# Patient Record
Sex: Male | Born: 1965
Health system: Southern US, Community
[De-identification: ages and names within clinical notes are randomized; demographics above are authoritative.]

## PROBLEM LIST (undated history)

## (undated) DIAGNOSIS — E1110 Type 2 diabetes mellitus with ketoacidosis without coma: Secondary | ICD-10-CM

## (undated) DIAGNOSIS — W3400XA Accidental discharge from unspecified firearms or gun, initial encounter: Secondary | ICD-10-CM

## (undated) DIAGNOSIS — G709 Myoneural disorder, unspecified: Secondary | ICD-10-CM

## (undated) DIAGNOSIS — R945 Abnormal results of liver function studies: Secondary | ICD-10-CM

## (undated) DIAGNOSIS — N179 Acute kidney failure, unspecified: Secondary | ICD-10-CM

## (undated) DIAGNOSIS — I480 Paroxysmal atrial fibrillation: Secondary | ICD-10-CM

## (undated) DIAGNOSIS — I499 Cardiac arrhythmia, unspecified: Secondary | ICD-10-CM

## (undated) DIAGNOSIS — M25561 Pain in right knee: Secondary | ICD-10-CM

## (undated) DIAGNOSIS — M25562 Pain in left knee: Secondary | ICD-10-CM

## (undated) DIAGNOSIS — Z87442 Personal history of urinary calculi: Secondary | ICD-10-CM

## (undated) DIAGNOSIS — I4892 Unspecified atrial flutter: Secondary | ICD-10-CM

## (undated) DIAGNOSIS — F32A Depression, unspecified: Secondary | ICD-10-CM

## (undated) DIAGNOSIS — R011 Cardiac murmur, unspecified: Secondary | ICD-10-CM

## (undated) DIAGNOSIS — E119 Type 2 diabetes mellitus without complications: Secondary | ICD-10-CM

## (undated) DIAGNOSIS — E785 Hyperlipidemia, unspecified: Secondary | ICD-10-CM

## (undated) DIAGNOSIS — K219 Gastro-esophageal reflux disease without esophagitis: Secondary | ICD-10-CM

## (undated) DIAGNOSIS — F329 Major depressive disorder, single episode, unspecified: Secondary | ICD-10-CM

## (undated) DIAGNOSIS — I10 Essential (primary) hypertension: Secondary | ICD-10-CM

## (undated) DIAGNOSIS — M109 Gout, unspecified: Secondary | ICD-10-CM

## (undated) HISTORY — DX: Unspecified atrial flutter: I48.92

## (undated) HISTORY — PX: SPLENECTOMY, TOTAL: SHX788

## (undated) HISTORY — DX: Type 2 diabetes mellitus with ketoacidosis without coma: E11.10

## (undated) HISTORY — PX: FRACTURE SURGERY: SHX138

## (undated) HISTORY — DX: Hyperlipidemia, unspecified: E78.5

## (undated) HISTORY — PX: ABDOMINAL SURGERY: SHX537

## (undated) HISTORY — DX: Paroxysmal atrial fibrillation: I48.0

## (undated) HISTORY — PX: OTHER SURGICAL HISTORY: SHX169

## (undated) HISTORY — DX: Myoneural disorder, unspecified: G70.9

---
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
## 2007-06-02 ENCOUNTER — Emergency Department (HOSPITAL_COMMUNITY): Admission: EM | Admit: 2007-06-02 | Discharge: 2007-06-02 | Payer: Self-pay | Admitting: Emergency Medicine

## 2007-06-10 ENCOUNTER — Emergency Department (HOSPITAL_COMMUNITY): Admission: EM | Admit: 2007-06-10 | Discharge: 2007-06-10 | Payer: Self-pay | Admitting: Emergency Medicine

## 2008-05-17 ENCOUNTER — Emergency Department (HOSPITAL_COMMUNITY): Admission: EM | Admit: 2008-05-17 | Discharge: 2008-05-17 | Payer: Self-pay | Admitting: Emergency Medicine

## 2010-07-05 LAB — URINE MICROSCOPIC-ADD ON

## 2010-07-05 LAB — COMPREHENSIVE METABOLIC PANEL
AST: 33 U/L (ref 0–37)
Albumin: 4.2 g/dL (ref 3.5–5.2)
Chloride: 102 mEq/L (ref 96–112)
Creatinine, Ser: 1.07 mg/dL (ref 0.4–1.5)
GFR calc Af Amer: >60 mL/min (ref >60)
Potassium: 3.9 mEq/L (ref 3.5–5.1)
Total Bilirubin: 1 mg/dL (ref 0.3–1.2)
Total Protein: 7.4 g/dL (ref 6.0–8.3)

## 2010-07-05 LAB — CBC
MCV: 85.7 fL (ref 78.0–100.0)
Platelets: 251 10*3/uL (ref 150–400)
RDW: 14.7 % (ref 11.5–15.5)
WBC: 10.8 10*3/uL — ABNORMAL HIGH (ref 4.0–10.5)

## 2010-07-05 LAB — DIFFERENTIAL
Basophils Absolute: 0 10*3/uL (ref 0.0–0.1)
Eosinophils Relative: 1 % (ref 0–5)
Lymphocytes Relative: 20 % (ref 12–46)
Monocytes Absolute: 0.6 10*3/uL (ref 0.1–1.0)
Monocytes Relative: 6 % (ref 3–12)

## 2010-07-05 LAB — URINALYSIS, ROUTINE W REFLEX MICROSCOPIC
Glucose, UA: NEGATIVE mg/dL
Leukocytes, UA: NEGATIVE
pH: 6 (ref 5.0–8.0)

## 2010-09-02 ENCOUNTER — Inpatient Hospital Stay (INDEPENDENT_AMBULATORY_CARE_PROVIDER_SITE_OTHER)
Admission: RE | Admit: 2010-09-02 | Discharge: 2010-09-02 | Disposition: A | Discharge status: Home / Self Care | Payer: Self-pay | Source: Ambulatory Visit | Attending: Emergency Medicine | Admitting: Emergency Medicine

## 2010-09-02 DIAGNOSIS — L259 Unspecified contact dermatitis, unspecified cause: Secondary | ICD-10-CM

## 2010-12-12 LAB — I-STAT 8, (EC8 V) (CONVERTED LAB)
BUN: 10
Bicarbonate: 20.1
Glucose, Bld: 139 — ABNORMAL HIGH
Sodium: 138
TCO2: 21
pCO2, Ven: 36 — ABNORMAL LOW
pH, Ven: 7.355 — ABNORMAL HIGH

## 2010-12-12 LAB — CBC
HCT: 41.1
Hemoglobin: 13.8
MCHC: 33.5
MCV: 83.8
RBC: 4.91
RDW: 14.4

## 2010-12-12 LAB — POCT I-STAT CREATININE: Creatinine, Ser: 1.2

## 2011-03-03 ENCOUNTER — Encounter: Payer: Self-pay | Admitting: Emergency Medicine

## 2011-03-03 ENCOUNTER — Emergency Department (INDEPENDENT_AMBULATORY_CARE_PROVIDER_SITE_OTHER)
Admission: EM | Admit: 2011-03-03 | Discharge: 2011-03-03 | Disposition: A | Discharge status: Home / Self Care | Payer: Self-pay | Source: Home / Self Care | Attending: Family Medicine | Admitting: Family Medicine

## 2011-03-03 DIAGNOSIS — M109 Gout, unspecified: Secondary | ICD-10-CM

## 2011-03-03 LAB — URIC ACID: Uric Acid, Serum: 7.5 mg/dL (ref 4.0–7.8)

## 2011-03-03 MED ORDER — INDOMETHACIN 50 MG PO CAPS: 50.0000 mg | ORAL_CAPSULE | Freq: Three times a day (TID) | ORAL | Status: AC

## 2011-03-03 NOTE — ED Provider Notes (Signed)
History     CSN: [id number] Arrival date & time: 03/03/2011 12:40 PM   First MD Initiated Contact with Patient 03/03/11 1328      Chief Complaint  Patient presents with  . Toe Pain    (Consider location/radiation/quality/duration/timing/severity/associated sxs/prior treatment) HPI Comments: Earnest Rosier Tawni Carnes) presents for evaluation of pain, warmth in the MP joint of his RIGHT great toe. He reports a similar hx last year around Thanksgiving, that resolved after he drank cherry juice and took Aleve. He does not carry a formal diagnosis.   Patient is a 45 y.o. male presenting with toe pain. The history is provided by the patient.  Toe Pain This is a recurrent problem. The current episode started more than 2 days ago. The problem occurs constantly. The problem has not changed since onset.The symptoms are aggravated by bending. The symptoms are relieved by nothing.    History reviewed. No pertinent past medical history.  History reviewed. No pertinent past surgical history.  History reviewed. No pertinent family history.  History  Substance Use Topics  . Smoking status: Never Smoker   . Smokeless tobacco: Current User  . Alcohol Use: Yes     Occasional      Review of Systems  Constitutional: Negative.   HENT: Negative.   Eyes: Negative.   Respiratory: Negative.   Cardiovascular: Negative.   Gastrointestinal: Negative.   Genitourinary: Negative.   Musculoskeletal: Positive for arthralgias. Negative for joint swelling.  Skin: Negative.   Neurological: Negative.     Allergies  Review of patient's allergies indicates no known allergies.  Home Medications   Current Outpatient Rx  Name Route Sig Dispense Refill  . INDOMETHACIN 50 MG PO CAPS Oral Take 1 capsule (50 mg total) by mouth 3 (three) times daily with meals. 30 capsule 0    BP 149/100  Pulse 78  Temp(Src) 98 F (36.7 C) (Oral)  Resp 20  SpO2 97%  Physical Exam  Nursing note and vitals  reviewed. Constitutional: He is oriented to person, place, and time. He appears well-developed and well-nourished.  HENT:  Head: Normocephalic and atraumatic.  Eyes: EOM are normal.  Neck: Normal range of motion.  Pulmonary/Chest: Effort normal.  Musculoskeletal:       Right foot: He exhibits decreased range of motion and tenderness.       Feet:  Neurological: He is alert and oriented to person, place, and time.  Skin: Skin is warm and dry.  Psychiatric: His behavior is normal.    ED Course  Procedures (including critical care time)   Labs Reviewed  URIC ACID   No results found.   1. Gout       MDM  Uric acid level drawn, pending; will treat empirically and follow result.        Richardo Priest, MD 03/03/11 [phone]

## 2011-03-03 NOTE — ED Notes (Addendum)
Monday toe great toe started hurting, no injury that he is aware of. Pt worries he may have over done it when working out over the weekend. Pain started in knee, then moved to toe. Knee does not hurt. Pt has had Gout in the past.

## 2011-11-02 ENCOUNTER — Encounter (HOSPITAL_COMMUNITY): Payer: Self-pay | Admitting: Emergency Medicine

## 2011-11-02 ENCOUNTER — Emergency Department (HOSPITAL_COMMUNITY)
Admission: EM | Admit: 2011-11-02 | Discharge: 2011-11-02 | Disposition: A | Discharge status: Home / Self Care | Payer: Self-pay | Source: Home / Self Care | Attending: Emergency Medicine | Admitting: Emergency Medicine

## 2011-11-02 DIAGNOSIS — M7040 Prepatellar bursitis, unspecified knee: Secondary | ICD-10-CM

## 2011-11-02 DIAGNOSIS — M7042 Prepatellar bursitis, left knee: Secondary | ICD-10-CM

## 2011-11-02 HISTORY — DX: Pain in right knee: M25.562

## 2011-11-02 HISTORY — DX: Accidental discharge from unspecified firearms or gun, initial encounter: W34.00XA

## 2011-11-02 HISTORY — DX: Pain in right knee: M25.561

## 2011-11-02 MED ORDER — MELOXICAM 15 MG PO TABS: 15.0000 mg | ORAL_TABLET | Freq: Every day | ORAL | Status: AC

## 2011-11-02 MED ORDER — HYDROCODONE-ACETAMINOPHEN 5-325 MG PO TABS: 2.0000 | ORAL_TABLET | ORAL | Status: AC | PRN
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
## 2011-11-02 NOTE — ED Notes (Signed)
Instructed to provide work note

## 2011-11-02 NOTE — ED Notes (Signed)
Reports left knee pain onset one week ago.  No particular injury.  Reports pain getting worse, history of the same.  Reports pain above left knee the "tendon"

## 2016-01-30 ENCOUNTER — Emergency Department (HOSPITAL_COMMUNITY): Payer: BLUE CROSS/BLUE SHIELD

## 2016-01-30 ENCOUNTER — Emergency Department (HOSPITAL_COMMUNITY)
Admission: EM | Admit: 2016-01-30 | Discharge: 2016-01-30 | Disposition: A | Discharge status: Home / Self Care | Payer: BLUE CROSS/BLUE SHIELD | Attending: Emergency Medicine | Admitting: Emergency Medicine

## 2016-01-30 ENCOUNTER — Encounter (HOSPITAL_COMMUNITY): Payer: Self-pay | Admitting: Emergency Medicine

## 2016-01-30 DIAGNOSIS — R55 Syncope and collapse: Secondary | ICD-10-CM | POA: Diagnosis not present

## 2016-01-30 DIAGNOSIS — Z79899 Other long term (current) drug therapy: Secondary | ICD-10-CM | POA: Insufficient documentation

## 2016-01-30 DIAGNOSIS — N200 Calculus of kidney: Secondary | ICD-10-CM

## 2016-01-30 DIAGNOSIS — N132 Hydronephrosis with renal and ureteral calculous obstruction: Secondary | ICD-10-CM | POA: Diagnosis not present

## 2016-01-30 DIAGNOSIS — R1031 Right lower quadrant pain: Secondary | ICD-10-CM | POA: Diagnosis present

## 2016-01-30 LAB — COMPREHENSIVE METABOLIC PANEL
ALT: 30 U/L (ref 17–63)
ANION GAP: 12 (ref 5–15)
AST: 28 U/L (ref 15–41)
Albumin: 4.9 g/dL (ref 3.5–5.0)
Alkaline Phosphatase: 99 U/L (ref 38–126)
BUN: 10 mg/dL (ref 6–20)
CALCIUM: 9.3 mg/dL (ref 8.9–10.3)
CHLORIDE: 100 mmol/L — AB (ref 101–111)
CO2: 27 mmol/L (ref 22–32)
Creatinine, Ser: 1.2 mg/dL (ref 0.61–1.24)
GFR calc non Af Amer: >60 mL/min (ref >60)
Glucose, Bld: 97 mg/dL (ref 65–99)
POTASSIUM: 4.3 mmol/L (ref 3.5–5.1)
SODIUM: 139 mmol/L (ref 135–145)
Total Bilirubin: 0.4 mg/dL (ref 0.3–1.2)
Total Protein: 8.7 g/dL — ABNORMAL HIGH (ref 6.5–8.1)

## 2016-01-30 LAB — CBC WITH DIFFERENTIAL/PLATELET
BASOS PCT: 0 %
Basophils Absolute: 0 10*3/uL (ref 0.0–0.1)
EOS ABS: 0.1 10*3/uL (ref 0.0–0.7)
EOS PCT: 1 %
HCT: 45.7 % (ref 39.0–52.0)
Hemoglobin: 15 g/dL (ref 13.0–17.0)
LYMPHS ABS: 1.7 10*3/uL (ref 0.7–4.0)
Lymphocytes Relative: 26 %
MCH: 27.7 pg (ref 26.0–34.0)
MCHC: 32.8 g/dL (ref 30.0–36.0)
MCV: 84.3 fL (ref 78.0–100.0)
MONOS PCT: 7 %
Monocytes Absolute: 0.5 10*3/uL (ref 0.1–1.0)
Neutro Abs: 4.4 10*3/uL (ref 1.7–7.7)
Neutrophils Relative %: 66 %
PLATELETS: 278 10*3/uL (ref 150–400)
RBC: 5.42 MIL/uL (ref 4.22–5.81)
RDW: 14.3 % (ref 11.5–15.5)
WBC: 6.7 10*3/uL (ref 4.0–10.5)

## 2016-01-30 LAB — URINALYSIS, ROUTINE W REFLEX MICROSCOPIC
Bilirubin Urine: NEGATIVE
Glucose, UA: NEGATIVE mg/dL
KETONES UR: NEGATIVE mg/dL
LEUKOCYTES UA: NEGATIVE
NITRITE: NEGATIVE
PH: 6 (ref 5.0–8.0)
Protein, ur: 100 mg/dL — AB
SPECIFIC GRAVITY, URINE: 1.021 (ref 1.005–1.030)

## 2016-01-30 LAB — RAPID URINE DRUG SCREEN, HOSP PERFORMED
Amphetamines: NOT DETECTED
BARBITURATES: NOT DETECTED
BENZODIAZEPINES: NOT DETECTED
COCAINE: NOT DETECTED
Opiates: NOT DETECTED
Tetrahydrocannabinol: NOT DETECTED

## 2016-01-30 LAB — I-STAT TROPONIN, ED: Troponin i, poc: 0.01 ng/mL (ref 0.00–0.08)

## 2016-01-30 LAB — URINE MICROSCOPIC-ADD ON
BACTERIA UA: NONE SEEN
Squamous Epithelial / LPF: NONE SEEN

## 2016-01-30 MED ORDER — SODIUM CHLORIDE 0.9 % IV BOLUS (SEPSIS)
1000.0000 mL | Freq: Once | INTRAVENOUS | Status: AC
  Administered 2016-01-30: 1000 mL via INTRAVENOUS

## 2016-01-30 MED ORDER — NAPROXEN 500 MG PO TABS: 500.0000 mg | ORAL_TABLET | Freq: Two times a day (BID) | ORAL | 0 refills | Status: DC

## 2016-01-30 MED ORDER — ONDANSETRON 4 MG PO TBDP: 4.0000 mg | ORAL_TABLET | Freq: Three times a day (TID) | ORAL | 0 refills | Status: DC | PRN

## 2016-01-30 MED ORDER — OXYCODONE-ACETAMINOPHEN 5-325 MG PO TABS: 1.0000 | ORAL_TABLET | Freq: Four times a day (QID) | ORAL | 0 refills | Status: DC | PRN

## 2016-01-30 MED ORDER — KETOROLAC TROMETHAMINE 30 MG/ML IJ SOLN
30.0000 mg | Freq: Once | INTRAMUSCULAR | Status: AC
  Administered 2016-01-30: 30 mg via INTRAVENOUS
  Filled 2016-01-30: qty 1

## 2016-01-30 MED ORDER — ONDANSETRON HCL 4 MG/2ML IJ SOLN
4.0000 mg | Freq: Once | INTRAMUSCULAR | Status: AC
  Administered 2016-01-30: 4 mg via INTRAVENOUS
  Filled 2016-01-30: qty 2

## 2016-01-30 NOTE — ED Notes (Signed)
PA at bedside.
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
## 2016-01-30 NOTE — ED Notes (Signed)
Pt ambulated to restroom earlier in shift

## 2016-01-30 NOTE — ED Notes (Signed)
MD at bedside. 

## 2016-01-30 NOTE — ED Notes (Signed)
Pt.'s wife at bedside and told this Nurse that pt. did passed out in the bathroom but not sure how long.

## 2016-01-30 NOTE — ED Notes (Signed)
Per MD order  U/A. Pt asleep w/family at bedside. Urinal at bedside for pt to use once awake to provide sample. Family member states will inform pt to provide sample once awake. [employer]

## 2016-01-30 NOTE — ED Triage Notes (Signed)
Per EMS , pt. From home with complaint of  right flank pain at 10/10 upon waking up this morning at 5am. Pt. Also reported of near syncope . Pt. Was diagnosed of kidney stone on Thursday, on pain med. But this morning pain was getting worse. initail BP taken by EMS was 88/5941mmhg, pt. On BP meds daily. Received about 100ml of NS via EMS, latest BP 135/7888mmhg.

## 2016-01-30 NOTE — ED Notes (Signed)
Bed: WA23 Expected date:  Expected time:  Means of arrival:  Comments: EMS 

## 2016-01-30 NOTE — ED Notes (Signed)
F/U re: U/A sample. Pt out of room completing diagnostic exam. Will f/u upon return. [employer]

## 2016-01-30 NOTE — Discharge Instructions (Signed)
You were noted to have a 7mm kidney stone on the right side. This stone is almost to the bladder and may or may not pass on its own. The sensation you experienced this morning is suspected to be what is called a vasovagal reaction to pain. It is recommended that you follow-up with urology as soon as possible, but should be seen sometime in the coming week.  Continue using naproxen or ibuprofen for pain. Percocet for severe pain. Do not take medications like tramadol or Percocet while driving or performing other dangerous activities. Zofran for nausea or vomiting.

## 2016-02-03 ENCOUNTER — Other Ambulatory Visit: Payer: Self-pay | Admitting: Urology

## 2016-02-03 ENCOUNTER — Encounter (HOSPITAL_COMMUNITY): Payer: Self-pay | Admitting: *Deleted

## 2016-02-07 ENCOUNTER — Other Ambulatory Visit: Payer: Self-pay | Admitting: Urology

## 2016-02-07 ENCOUNTER — Ambulatory Visit (HOSPITAL_COMMUNITY): Admission: RE | Admit: 2016-02-07 | Payer: BLUE CROSS/BLUE SHIELD | Source: Ambulatory Visit | Admitting: Urology

## 2016-02-07 HISTORY — DX: Essential (primary) hypertension: I10

## 2016-02-07 HISTORY — DX: Personal history of urinary calculi: Z87.442

## 2016-02-07 SURGERY — LITHOTRIPSY, ESWL
Anesthesia: LOCAL | Laterality: Right

## 2016-02-08 ENCOUNTER — Encounter (HOSPITAL_COMMUNITY): Payer: Self-pay | Admitting: *Deleted

## 2016-02-14 ENCOUNTER — Ambulatory Visit (HOSPITAL_COMMUNITY)
Admission: RE | Admit: 2016-02-14 | Discharge: 2016-02-14 | Disposition: A | Discharge status: Home / Self Care | Payer: BLUE CROSS/BLUE SHIELD | Source: Ambulatory Visit | Attending: Urology | Admitting: Urology

## 2016-02-14 ENCOUNTER — Encounter (HOSPITAL_COMMUNITY): Payer: Self-pay | Admitting: General Practice

## 2016-02-14 ENCOUNTER — Encounter (HOSPITAL_COMMUNITY)
Admission: RE | Disposition: A | Discharge status: Home / Self Care | Payer: Self-pay | Source: Ambulatory Visit | Attending: Urology

## 2016-02-14 ENCOUNTER — Ambulatory Visit (HOSPITAL_COMMUNITY): Payer: BLUE CROSS/BLUE SHIELD

## 2016-02-14 DIAGNOSIS — I10 Essential (primary) hypertension: Secondary | ICD-10-CM | POA: Diagnosis not present

## 2016-02-14 DIAGNOSIS — F17200 Nicotine dependence, unspecified, uncomplicated: Secondary | ICD-10-CM | POA: Diagnosis not present

## 2016-02-14 DIAGNOSIS — N201 Calculus of ureter: Secondary | ICD-10-CM | POA: Insufficient documentation

## 2016-02-14 SURGERY — LITHOTRIPSY, ESWL
Anesthesia: LOCAL | Laterality: Right

## 2016-02-14 MED ORDER — CIPROFLOXACIN HCL 500 MG PO TABS
500.0000 mg | ORAL_TABLET | ORAL | Status: AC
  Administered 2016-02-14: 500 mg via ORAL
  Filled 2016-02-14: qty 1

## 2016-02-14 MED ORDER — SODIUM CHLORIDE 0.9 % IV SOLN
INTRAVENOUS | Status: DC
  Administered 2016-02-14: 07:00:00 via INTRAVENOUS

## 2016-02-14 MED ORDER — DIPHENHYDRAMINE HCL 25 MG PO CAPS
25.0000 mg | ORAL_CAPSULE | ORAL | Status: AC
  Administered 2016-02-14: 25 mg via ORAL
  Filled 2016-02-14: qty 1

## 2016-02-14 MED ORDER — OXYCODONE HCL 10 MG PO TABS: 10.0000 mg | ORAL_TABLET | ORAL | 0 refills | Status: DC | PRN

## 2016-02-14 MED ORDER — TAMSULOSIN HCL 0.4 MG PO CAPS: 0.4000 mg | ORAL_CAPSULE | ORAL | 0 refills | Status: DC

## 2016-02-14 MED ORDER — DIAZEPAM 5 MG PO TABS
10.0000 mg | ORAL_TABLET | ORAL | Status: AC
  Administered 2016-02-14: 10 mg via ORAL
  Filled 2016-02-14: qty 2

## 2016-02-14 NOTE — Discharge Instructions (Signed)
Lithotripsy, Care After °Refer to this sheet in the next few weeks. These instructions provide you with information on caring for yourself after your procedure. Your health care provider may also give you more specific instructions. Your treatment has been planned according to current medical practices, but problems sometimes occur. Call your health care provider if you have any problems or questions after your procedure. °WHAT TO EXPECT AFTER THE PROCEDURE  °· Your urine may have a red tinge for a few days after treatment. Blood loss is usually minimal. °· You may have soreness in the back or flank area. This usually goes away after a few days. The procedure can cause blotches or bruises on the back where the pressure wave enters the skin. These marks usually cause only minimal discomfort and should disappear in a short time. °· Stone fragments should begin to pass within 24 hours of treatment. However, a delayed passage is not unusual. °· You may have pain, discomfort, and feel sick to your stomach (nauseated) when the crushed fragments of stone are passed down the tube from the kidney to the bladder. Stone fragments can pass soon after the procedure and may last for up to 4-8 weeks. °· A small number of patients may have severe pain when stone fragments are not able to pass, which leads to an obstruction. °· If your stone is greater than 1 inch (2.5 cm) in diameter or if you have multiple stones that have a combined diameter greater than 1 inch (2.5 cm), you may require more than one treatment. °· If you had a stent placed prior to your procedure, you may experience some discomfort, especially during urination. You may experience the pain or discomfort in your flank or back, or you may experience a sharp pain or discomfort at the base of your penis or in your lower abdomen. The discomfort usually lasts only a few minutes after urinating. °HOME CARE INSTRUCTIONS  °· Rest at home until you feel your energy  improving. °· Only take over-the-counter or prescription medicines for pain, discomfort, or fever as directed by your health care provider. Depending on the type of lithotripsy, you may need to take antibiotics and anti-inflammatory medicines for a few days. °· Drink enough water and fluids to keep your urine clear or pale yellow. This helps "flush" your kidneys. It helps pass any remaining pieces of stone and prevents stones from coming back. °· Most people can resume daily activities within 1-2 days after standard lithotripsy. It can take longer to recover from laser and percutaneous lithotripsy. °· Strain all urine through the provided strainer. Keep all particulate matter and stones for your health care provider to see. The stone may be as small as a grain of salt. It is very important to use the strainer each and every time you pass your urine. Any stones that are found can be sent to a medical lab for examination. °· Visit your health care provider for a follow-up appointment in a few weeks. Your doctor may remove your stent if you have one. Your health care provider will also check to see whether stone particles still remain. °SEEK MEDICAL CARE IF:  °· Your pain is not relieved by medicine. °· You have a lasting nauseous feeling. °· You feel there is too much blood in the urine. °· You develop persistent problems with frequent or painful urination that does not at least partially improve after 2 days following the procedure. °· You have a congested cough. °· You feel   lightheaded. °· You develop a rash or any other signs that might suggest an allergic problem. °· You develop any reaction or side effects to your medicine(s). °SEEK IMMEDIATE MEDICAL CARE IF:  °· You experience severe back or flank pain or both. °· You see nothing but blood when you urinate. °· You cannot pass any urine at all. °· You have a fever or shaking chills. °· You develop shortness of breath, difficulty breathing, or chest pain. °· You  develop vomiting that will not stop after 6-8 hours. °· You have a fainting episode. °This information is not intended to replace advice given to you by your health care provider. Make sure you discuss any questions you have with your health care provider. °Document Released: 03/26/2007 Document Revised: 11/25/2014 Document Reviewed: 09/19/2012 °Elsevier Interactive Patient Education © 2017 [employer] ° °

## 2016-02-14 NOTE — Op Note (Signed)
See [facility] OP note scanned into chart. Also because of the size, density, location and other factors that cannot be anticipated I feel this will likely be a staged procedure. This fact supersedes any indication in the scanned [facility] operative note to the contrary.  

## 2016-02-14 NOTE — H&P (Signed)
CC/HPI: Right ureteral calculus    [name] is a 50 year old male patient who developed the severe onset of right-sided flank pain that actually was so severe it resulted in a syncopal episode after presenting to the emergency department. It was ruled out that he had any other cause for syncope other than pain and probable dehydration. He did undergo a CT scan of the abdomen and pelvis on 01/30/16 demonstrated a 7 mm distal right ureteral calculus. He was also incidentally noted to have a very small 2 mm lower pole left renal calculus. He has denied any fever. He has had some nausea but no vomiting. He did have 1 prior episode of a kidney stone 5 years ago that he passed spontaneously. He has not previously required intervention.     ALLERGIES: None   MEDICATIONS: Lisinopril  Naproxen  Tramadol Hcl  Zofran     GU PSH: None   NON-GU PSH: Exploratory Laparotomy Repair Of Anorectal Fistula With Plug (Eg, Porcine Small Intestine Submucosa [Sis])    GU PMH: None   NON-GU PMH: GERD Hypertension    FAMILY HISTORY: 1 son - Runs in Family Cancer - Mother stroke - Mother   SOCIAL HISTORY: Marital Status: Single Current Smoking Status: Patient smokes.  Does drink.  Drinks 1 caffeinated drink per day.    REVIEW OF SYSTEMS:    GU Review Male:   Patient reports frequent urination and stream starts and stops. Patient denies hard to postpone urination, burning/ pain with urination, get up at night to urinate, leakage of urine, trouble starting your streams, and have to strain to urinate .  Gastrointestinal (Upper):   Patient reports nausea. Patient denies vomiting.  Gastrointestinal (Lower):   Patient denies diarrhea and constipation.  Constitutional:   Patient reports fever. Patient denies night sweats, weight loss, and fatigue.  Skin:   Patient denies skin rash/ lesion and itching.  Eyes:   Patient denies blurred vision and double vision.  Ears/ Nose/ Throat:   Patient denies sore throat  and sinus problems.  Hematologic/Lymphatic:   Patient denies swollen glands and easy bruising.  Cardiovascular:   Patient denies leg swelling and chest pains.  Respiratory:   Patient denies cough and shortness of breath.  Endocrine:   Patient denies excessive thirst.  Musculoskeletal:   Patient denies joint pain and back pain.  Neurological:   Patient denies headaches and dizziness.  Psychologic:   Patient denies depression and anxiety.   VITAL SIGNS:        Weight 260 lb / 117.93 kg  Height 74 in / 187.96 cm  BP 137/83 mmHg  Pulse 56 /min  BMI 33.4 kg/m   MULTI-SYSTEM PHYSICAL EXAMINATION:    Constitutional: Well-nourished. No physical deformities. Normally developed. Good grooming.  Neck: Neck symmetrical, not swollen. Normal tracheal position.  Respiratory: No labored breathing, no use of accessory muscles. Clear bilaterally.  Cardiovascular: Normal temperature, normal extremity pulses, no swelling, no varicosities. Regular rate and rhythm.  Lymphatic: No enlargement of neck, axillae, groin.  Skin: No paleness, no jaundice, no cyanosis. No lesion, no ulcer, no rash.  Neurologic / Psychiatric: Oriented to time, oriented to place, oriented to person. No depression, no anxiety, no agitation.  Gastrointestinal: He has a very large well-healed midline incision from prior exploratory laparotomy.  Eyes: Normal conjunctivae. Normal eyelids.  Ears, Nose, Mouth, and Throat: Left ear no scars, no lesions, no masses. Right ear no scars, no lesions, no masses. Nose no scars, no lesions, no masses. Normal hearing.  Normal lips.  Musculoskeletal: Normal gait and station of head and neck.     PAST DATA REVIEWED:  Source Of History:  Patient  Urine Test Review:   Urinalysis  X-Ray Review: KUB: Reviewed Films. I independently reviewed his KUB x-ray. He does have a 7 mm radiopaque calculus in the vicinity of the distal right ureter that is easily seen. C.T. Abdomen/Pelvis: Reviewed Films. 7 mm  distal right ureteral stone. 2 mm left lower pole renal calculus.   Notes:                     Creatinine 1.2          Urinalysis Dipstick Dipstick Cont'd  Color: Yellow Bilirubin: Neg  Appearance: Clear Ketones: Neg  Specific Gravity: 1.020 Blood: Neg  pH: <=5.0 Protein: Neg  Glucose: Neg Urobilinogen: 0.2    Nitrites: Neg    Leukocyte Esterase: Neg    ASSESSMENT:      ICD-10 Details  1 GU:   Calculus Ureter - N20.1    PLAN:           Orders X-Rays: KUB          Schedule Return Visit: Other See Visit Notes             Note: Will call to schedule ESWL          Document Letter(s):  Created for Patient: Clinical Summary         Notes:   1. Right distal ureteral calculus: His stone is still present based on his symptoms and his KUB x-ray. We reviewed options and he would like to try to pass this stone over the next few days. However, he would like to tentatively plan for treatment early next week if he has not passed a stone. He does have prescriptions for both tramadol and Percocet to take as needed. He typically is using tramadol during the daytime and Percocet in the evenings as necessary. He also has an antiemetic. He has been instructed to call should he develop fever, uncontrolled pain, or persistent nausea/vomiting.    We reviewed options for definitive treatment including shockwave lithotripsy and ureteroscopic laser lithotripsy and the pros and cons of each approach. After discussing options, he is most interested in proceeding with shock wave lithotripsy of his distal right ureteral stone. We have reviewed potential risks, complications, and expected recovery process. All questions were answered to his stated satisfaction. He gives informed consent to proceed. This will be scheduled for next week.

## 2017-04-11 ENCOUNTER — Emergency Department (HOSPITAL_COMMUNITY)
Admission: EM | Admit: 2017-04-11 | Discharge: 2017-04-11 | Disposition: A | Discharge status: Home / Self Care | Payer: BLUE CROSS/BLUE SHIELD | Attending: Emergency Medicine | Admitting: Emergency Medicine

## 2017-04-11 ENCOUNTER — Encounter (HOSPITAL_COMMUNITY): Payer: Self-pay | Admitting: Emergency Medicine

## 2017-04-11 ENCOUNTER — Emergency Department (HOSPITAL_COMMUNITY): Payer: BLUE CROSS/BLUE SHIELD

## 2017-04-11 DIAGNOSIS — F17290 Nicotine dependence, other tobacco product, uncomplicated: Secondary | ICD-10-CM | POA: Insufficient documentation

## 2017-04-11 DIAGNOSIS — M5441 Lumbago with sciatica, right side: Secondary | ICD-10-CM

## 2017-04-11 DIAGNOSIS — R200 Anesthesia of skin: Secondary | ICD-10-CM | POA: Diagnosis not present

## 2017-04-11 DIAGNOSIS — M545 Low back pain: Secondary | ICD-10-CM | POA: Diagnosis not present

## 2017-04-11 DIAGNOSIS — R300 Dysuria: Secondary | ICD-10-CM | POA: Diagnosis not present

## 2017-04-11 DIAGNOSIS — Z87442 Personal history of urinary calculi: Secondary | ICD-10-CM | POA: Diagnosis not present

## 2017-04-11 DIAGNOSIS — I10 Essential (primary) hypertension: Secondary | ICD-10-CM | POA: Insufficient documentation

## 2017-04-11 LAB — I-STAT CHEM 8, ED
BUN: 13 mg/dL (ref 6–20)
CALCIUM ION: 1.17 mmol/L (ref 1.15–1.40)
CHLORIDE: 103 mmol/L (ref 101–111)
CREATININE: 1 mg/dL (ref 0.61–1.24)
GLUCOSE: 93 mg/dL (ref 65–99)
HCT: 41 % (ref 39.0–52.0)
Hemoglobin: 13.9 g/dL (ref 13.0–17.0)
Potassium: 4 mmol/L (ref 3.5–5.1)
Sodium: 140 mmol/L (ref 135–145)
TCO2: 25 mmol/L (ref 22–32)

## 2017-04-11 LAB — URINALYSIS, ROUTINE W REFLEX MICROSCOPIC
Bilirubin Urine: NEGATIVE
GLUCOSE, UA: NEGATIVE mg/dL
HGB URINE DIPSTICK: NEGATIVE
KETONES UR: NEGATIVE mg/dL
Leukocytes, UA: NEGATIVE
Nitrite: NEGATIVE
PH: 5 (ref 5.0–8.0)
PROTEIN: NEGATIVE mg/dL
Specific Gravity, Urine: 1.015 (ref 1.005–1.030)

## 2017-04-11 MED ORDER — NAPROXEN 375 MG PO TABS: 375.0000 mg | ORAL_TABLET | Freq: Two times a day (BID) | ORAL | 0 refills | Status: DC

## 2017-04-11 MED ORDER — METHOCARBAMOL 500 MG PO TABS: 500.0000 mg | ORAL_TABLET | Freq: Two times a day (BID) | ORAL | 0 refills | Status: DC

## 2017-04-11 MED ORDER — NAPROXEN 500 MG PO TABS
500.0000 mg | ORAL_TABLET | Freq: Once | ORAL | Status: AC
  Administered 2017-04-11: 500 mg via ORAL
  Filled 2017-04-11: qty 1

## 2017-04-11 MED ORDER — LIDOCAINE 5 % EX PTCH: 1.0000 | MEDICATED_PATCH | CUTANEOUS | 0 refills | Status: DC

## 2017-04-11 NOTE — ED Triage Notes (Signed)
Patient reports that he has lower back pains that radiate up back and down legs. 

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
## 2017-04-11 NOTE — ED Notes (Signed)
To radiology

## 2017-04-11 NOTE — Discharge Instructions (Signed)
Workup has been normal. Please take medications as prescribed and instructed.  Please take the Naproxen as prescribed for pain. Do not take any additional NSAIDs including Motrin, Aleve, Ibuprofen, Advil. Have been given first does in the ED.  Please the the robaxin for muscle relaxation. This medication will make you drowsy so avoid situation that could place you in danger.   Use lidocaine patches.  Stretch as able.  SEEK IMMEDIATE MEDICAL ATTENTION IF: New numbness, tingling, weakness, or problem with the use of your arms or legs.  Severe back pain not relieved with medications.  Change in bowel or bladder control.  Urinary retention.  Numbness in your groin.  Increasing pain in any areas of the body (such as chest or abdominal pain).  Shortness of breath, dizziness or fainting.  Nausea (feeling sick to your stomach), vomiting, fever, or sweats.

## 2017-04-11 NOTE — ED Notes (Signed)
Patient transported to X-ray 

## 2017-08-08 ENCOUNTER — Encounter (HOSPITAL_BASED_OUTPATIENT_CLINIC_OR_DEPARTMENT_OTHER): Payer: Self-pay | Admitting: Emergency Medicine

## 2017-08-08 ENCOUNTER — Emergency Department (HOSPITAL_BASED_OUTPATIENT_CLINIC_OR_DEPARTMENT_OTHER)
Admission: EM | Admit: 2017-08-08 | Discharge: 2017-08-08 | Disposition: A | Discharge status: Home / Self Care | Payer: BLUE CROSS/BLUE SHIELD | Attending: Emergency Medicine | Admitting: Emergency Medicine

## 2017-08-08 ENCOUNTER — Other Ambulatory Visit: Payer: Self-pay

## 2017-08-08 DIAGNOSIS — M545 Low back pain: Secondary | ICD-10-CM | POA: Diagnosis not present

## 2017-08-08 DIAGNOSIS — M62830 Muscle spasm of back: Secondary | ICD-10-CM | POA: Insufficient documentation

## 2017-08-08 DIAGNOSIS — X500XXA Overexertion from strenuous movement or load, initial encounter: Secondary | ICD-10-CM | POA: Insufficient documentation

## 2017-08-08 DIAGNOSIS — Y929 Unspecified place or not applicable: Secondary | ICD-10-CM | POA: Insufficient documentation

## 2017-08-08 DIAGNOSIS — I10 Essential (primary) hypertension: Secondary | ICD-10-CM | POA: Insufficient documentation

## 2017-08-08 DIAGNOSIS — E669 Obesity, unspecified: Secondary | ICD-10-CM | POA: Insufficient documentation

## 2017-08-08 DIAGNOSIS — Z79899 Other long term (current) drug therapy: Secondary | ICD-10-CM | POA: Diagnosis not present

## 2017-08-08 DIAGNOSIS — Y999 Unspecified external cause status: Secondary | ICD-10-CM | POA: Diagnosis not present

## 2017-08-08 DIAGNOSIS — Y939 Activity, unspecified: Secondary | ICD-10-CM | POA: Diagnosis not present

## 2017-08-08 DIAGNOSIS — Z6832 Body mass index (BMI) 32.0-32.9, adult: Secondary | ICD-10-CM | POA: Insufficient documentation

## 2017-08-08 DIAGNOSIS — S39012A Strain of muscle, fascia and tendon of lower back, initial encounter: Secondary | ICD-10-CM | POA: Insufficient documentation

## 2017-08-08 MED ORDER — CYCLOBENZAPRINE HCL 10 MG PO TABS: 10.0000 mg | ORAL_TABLET | Freq: Three times a day (TID) | ORAL | 0 refills | Status: DC | PRN

## 2017-08-08 MED ORDER — MELOXICAM 15 MG PO TABS: 15.0000 mg | ORAL_TABLET | Freq: Every day | ORAL | 0 refills | Status: DC

## 2017-08-08 MED ORDER — DEXAMETHASONE SODIUM PHOSPHATE 10 MG/ML IJ SOLN
10.0000 mg | Freq: Once | INTRAMUSCULAR | Status: AC
  Administered 2017-08-08: 10 mg via INTRAMUSCULAR
  Filled 2017-08-08: qty 1

## 2017-08-08 MED FILL — MELOXICAM 15 MG TABLET: 15 | 30 days supply | Qty: 30 | Fill #0

## 2017-08-08 MED FILL — CYCLOBENZAPRINE HCL 10 MG T: 10 | 5 days supply | Qty: 15 | Fill #0
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
## 2017-08-08 NOTE — ED Triage Notes (Signed)
Patient reports lower back pain for 2 months after lifting injury at work.

## 2017-08-08 NOTE — Discharge Instructions (Signed)
See the instructions below regarding your back pain. It's very important that you try to lose weight, as this will help your back pain. Follow up with the orthopedist in 1-2 weeks for reassessment and ongoing management of your back pain. Return to the ER for emergent changes or worsening symptoms.   Back Pain: Your back pain should be treated with medicines such as ibuprofen or aleve and this back pain should get better over the next 2 weeks.  However if you develop severe or worsening pain, low back pain with fever, numbness, weakness or inability to walk or urinate, you should return to the ER immediately.  Please follow up with your doctor this week for a recheck if still having symptoms.  Avoid heavy lifting over 10 pounds over the next two weeks.  Low back pain is discomfort in the lower back that may be due to injuries to muscles and ligaments around the spine.  Occasionally, it may be caused by a a problem to a part of the spine called a disc.  The pain may last several days or a week;  However, most patients get completely well in 4 weeks.  Self - care:  The application of heat can help soothe the pain.  Maintaining your daily activities, including walking, is encourged, as it will help you get better faster than just staying in bed. Perform gentle stretching as discussed. Drink plenty of fluids.  Medications are also useful to help with pain control.  A commonly prescribed medication includes over the counter Tylenol; take as directed on the bottle.   Non steroidal anti inflammatory medications including mobic;  These medications help both pain and swelling and are very useful in treating back pain.  They should be taken with food, as they can cause stomach upset, and more seriously, stomach bleeding.  Use mobic daily as directed, don't use additional NSAIDs like ibuprofen/aleve/etc while taking mobic.     Muscle relaxants:  These medications can help with muscle tightness that is a cause of  lower back pain.  Most of these medications can cause drowsiness, and it is not safe to drive or use dangerous machinery while taking them.  SEEK IMMEDIATE MEDICAL ATTENTION IF: New numbness, tingling, weakness, or problem with the use of your arms or legs.  Severe back pain not relieved with medications.  Difficulty with or loss of control of your bowel or bladder control.  Increasing pain in any areas of the body (such as chest or abdominal pain).  Shortness of breath, dizziness or fainting.  Nausea (feeling sick to your stomach), vomiting, fever, or sweats.

## 2017-08-20 ENCOUNTER — Encounter (INDEPENDENT_AMBULATORY_CARE_PROVIDER_SITE_OTHER): Payer: Self-pay | Admitting: Physician Assistant

## 2017-08-20 ENCOUNTER — Ambulatory Visit (INDEPENDENT_AMBULATORY_CARE_PROVIDER_SITE_OTHER): Payer: BLUE CROSS/BLUE SHIELD | Admitting: Physician Assistant

## 2017-08-20 DIAGNOSIS — M5440 Lumbago with sciatica, unspecified side: Secondary | ICD-10-CM

## 2017-08-20 MED ORDER — METHYLPREDNISOLONE 4 MG PO TABS: ORAL_TABLET | ORAL | 0 refills | Status: DC
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
## 2017-08-21 ENCOUNTER — Other Ambulatory Visit (INDEPENDENT_AMBULATORY_CARE_PROVIDER_SITE_OTHER): Payer: Self-pay

## 2017-08-30 ENCOUNTER — Telehealth (INDEPENDENT_AMBULATORY_CARE_PROVIDER_SITE_OTHER): Payer: Self-pay | Admitting: Physician Assistant

## 2017-08-30 NOTE — Telephone Encounter (Signed)
IC bryanna back and left message on vm advising to call back for IM[facility] # so I can take care of it.
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
## 2017-08-31 NOTE — Telephone Encounter (Signed)
I called [name] againg this am left vm to return my call

## 2017-09-03 ENCOUNTER — Encounter (INDEPENDENT_AMBULATORY_CARE_PROVIDER_SITE_OTHER): Payer: Self-pay | Admitting: Physician Assistant

## 2017-09-03 ENCOUNTER — Ambulatory Visit (INDEPENDENT_AMBULATORY_CARE_PROVIDER_SITE_OTHER): Payer: No Typology Code available for payment source

## 2017-09-03 ENCOUNTER — Ambulatory Visit (INDEPENDENT_AMBULATORY_CARE_PROVIDER_SITE_OTHER): Payer: No Typology Code available for payment source | Admitting: Physician Assistant

## 2017-09-03 DIAGNOSIS — M79602 Pain in left arm: Secondary | ICD-10-CM

## 2017-09-03 DIAGNOSIS — M795 Residual foreign body in soft tissue: Secondary | ICD-10-CM

## 2017-09-03 DIAGNOSIS — M79601 Pain in right arm: Secondary | ICD-10-CM
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
## 2017-09-06 ENCOUNTER — Telehealth (INDEPENDENT_AMBULATORY_CARE_PROVIDER_SITE_OTHER): Payer: Self-pay | Admitting: Physician Assistant

## 2017-09-06 NOTE — Telephone Encounter (Signed)
Patient called advised the over the counter medicine (Tylenol) is not working. Patient asked if he can get something for the pain. The number to contact patient is [phone]

## 2017-09-06 NOTE — Telephone Encounter (Signed)
Please advise 

## 2017-09-06 NOTE — Telephone Encounter (Signed)
TRAMADOL 50 MG ONE PO q6 #40 ZERO

## 2017-09-07 ENCOUNTER — Other Ambulatory Visit (INDEPENDENT_AMBULATORY_CARE_PROVIDER_SITE_OTHER): Payer: Self-pay

## 2017-09-07 ENCOUNTER — Telehealth (INDEPENDENT_AMBULATORY_CARE_PROVIDER_SITE_OTHER): Payer: Self-pay | Admitting: Orthopaedic Surgery

## 2017-09-07 MED ORDER — TRAMADOL HCL 50 MG PO TABS: 50.0000 mg | ORAL_TABLET | Freq: Four times a day (QID) | ORAL | 0 refills | Status: DC | PRN

## 2017-09-07 NOTE — Telephone Encounter (Signed)
[facility] letting him know I did so

## 2017-09-07 NOTE — Telephone Encounter (Signed)
Can you check on this one? Thanks. 

## 2017-09-07 NOTE — Telephone Encounter (Signed)
Emailed pts wc adj [facility] Sarhaddi @ [address] asking her to contact pharmacy [facility] and advised pt

## 2017-09-07 NOTE — Telephone Encounter (Signed)
Called into correct pharmacy

## 2017-09-07 NOTE — Telephone Encounter (Signed)
Patient called asking for [facility] [address]. Phone # [phone]

## 2017-09-07 NOTE — Telephone Encounter (Signed)
Prior authorization is needed throught patients workers comp or [employer] ? Patients # [phone]

## 2017-09-09 ENCOUNTER — Ambulatory Visit
Admission: RE | Admit: 2017-09-09 | Discharge: 2017-09-09 | Disposition: A | Discharge status: Home / Self Care | Payer: BLUE CROSS/BLUE SHIELD | Source: Ambulatory Visit | Attending: Physician Assistant | Admitting: Physician Assistant

## 2017-09-09 DIAGNOSIS — M48061 Spinal stenosis, lumbar region without neurogenic claudication: Secondary | ICD-10-CM | POA: Diagnosis not present

## 2017-09-09 DIAGNOSIS — M5440 Lumbago with sciatica, unspecified side: Secondary | ICD-10-CM

## 2017-09-13 ENCOUNTER — Telehealth (INDEPENDENT_AMBULATORY_CARE_PROVIDER_SITE_OTHER): Payer: Self-pay | Admitting: Physician Assistant

## 2017-09-13 NOTE — Telephone Encounter (Signed)
Patient had MRI at GI so he needs referral to [facility] closed, he said they keep calling him trying to make appt.

## 2017-09-14 NOTE — Telephone Encounter (Signed)
I called over to [facility] [address] and advised her to close the order for his mri, he has already had the mri at another facility.

## 2017-09-17 ENCOUNTER — Ambulatory Visit (INDEPENDENT_AMBULATORY_CARE_PROVIDER_SITE_OTHER): Payer: No Typology Code available for payment source | Admitting: Physician Assistant

## 2017-09-17 ENCOUNTER — Other Ambulatory Visit (INDEPENDENT_AMBULATORY_CARE_PROVIDER_SITE_OTHER): Payer: Self-pay

## 2017-09-17 ENCOUNTER — Encounter (INDEPENDENT_AMBULATORY_CARE_PROVIDER_SITE_OTHER): Payer: Self-pay | Admitting: Physician Assistant

## 2017-09-17 DIAGNOSIS — M5442 Lumbago with sciatica, left side: Secondary | ICD-10-CM | POA: Diagnosis not present

## 2017-09-17 DIAGNOSIS — M5441 Lumbago with sciatica, right side: Secondary | ICD-10-CM
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
## 2017-09-25 ENCOUNTER — Telehealth (INDEPENDENT_AMBULATORY_CARE_PROVIDER_SITE_OTHER): Payer: Self-pay

## 2017-09-25 NOTE — Telephone Encounter (Signed)
Emailed the 09/17/17 office note, work note, and ESI order to wc adj per her request

## 2017-10-01 ENCOUNTER — Telehealth (INDEPENDENT_AMBULATORY_CARE_PROVIDER_SITE_OTHER): Payer: Self-pay

## 2017-10-01 NOTE — Telephone Encounter (Signed)
I think the ESI referral/order was entered incorrectly for this patient so I wanted to check with you to see if [facility] or [facility]? Work comp has approved it so I was just needing to know where to schedule him.

## 2017-10-01 NOTE — Telephone Encounter (Signed)
[name] is fine

## 2017-10-01 NOTE — Telephone Encounter (Signed)
Entered referral for Dr. Alvester [facility]

## 2017-10-03 ENCOUNTER — Telehealth (INDEPENDENT_AMBULATORY_CARE_PROVIDER_SITE_OTHER): Payer: Self-pay | Admitting: Physician Assistant

## 2017-10-03 NOTE — Telephone Encounter (Signed)
That will be fine. Thanks 

## 2017-10-03 NOTE — Telephone Encounter (Signed)
Patient called asked if he can get a letter extending him being out until after his revisit with Bronson CurbGil or Dr. [name]. Patient said he will pick up letter. The injection is scheduled for 10/22/17 with Dr. Alvester Morin[place]. The number to contact patient is [phone]

## 2017-10-03 NOTE — Telephone Encounter (Signed)
Note written to be OOW through 10/24/17, as patient does not have f/u with Dr [name].  I have advised patient if he needs to come back and see Dr [name], that it needs to be 2 weeks after the injection approx. He understands.  He will come by and pickup the letter.

## 2017-10-03 NOTE — Telephone Encounter (Signed)
Is this ok to do?

## 2017-10-22 ENCOUNTER — Ambulatory Visit (INDEPENDENT_AMBULATORY_CARE_PROVIDER_SITE_OTHER): Payer: Self-pay

## 2017-10-22 ENCOUNTER — Encounter

## 2017-10-22 ENCOUNTER — Ambulatory Visit (INDEPENDENT_AMBULATORY_CARE_PROVIDER_SITE_OTHER): Payer: No Typology Code available for payment source | Admitting: Physical Medicine and Rehabilitation

## 2017-10-22 VITALS — BP 149/96 | HR 80 | Temp 98.1°F

## 2017-10-22 DIAGNOSIS — M5416 Radiculopathy, lumbar region: Secondary | ICD-10-CM

## 2017-10-22 MED ORDER — BETAMETHASONE SOD PHOS & ACET 6 (3-3) MG/ML IJ SUSP
12.0000 mg | Freq: Once | INTRAMUSCULAR | Status: AC
  Administered 2017-10-22: 12 mg

## 2017-10-22 NOTE — Progress Notes (Signed)
 .  Numeric Pain Rating Scale and Functional Assessment Average Pain 7   In the last MONTH (on 0-10 scale) has pain interfered with the following?  1. General activity like being  able to carry out your everyday physical activities such as walking, climbing stairs, carrying groceries, or moving a chair?  Rating(5)   +Driver, -BT, -Dye Allergies.  

## 2017-10-22 NOTE — Patient Instructions (Signed)

## 2017-10-25 ENCOUNTER — Telehealth (INDEPENDENT_AMBULATORY_CARE_PROVIDER_SITE_OTHER): Payer: Self-pay | Admitting: *Deleted

## 2017-10-25 ENCOUNTER — Encounter (INDEPENDENT_AMBULATORY_CARE_PROVIDER_SITE_OTHER): Payer: Self-pay

## 2017-10-25 ENCOUNTER — Telehealth (INDEPENDENT_AMBULATORY_CARE_PROVIDER_SITE_OTHER): Payer: Self-pay

## 2017-10-25 NOTE — Telephone Encounter (Signed)
Note stating can return to work with restrictions of no lifting greater than 40lbs. No prolonged standing greater 2 hours at one time.  Date of return to work 10/29/17.

## 2017-10-25 NOTE — Telephone Encounter (Signed)
Emailed 10/22/17 office note to wc adj [employer] FoodsJennifer Sarhaddi @ [address] [place]([email or web address])

## 2017-10-25 NOTE — Telephone Encounter (Signed)
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

## 2017-10-25 NOTE — Telephone Encounter (Signed)
yes

## 2017-10-25 NOTE — Telephone Encounter (Signed)
Is this ok?

## 2017-10-26 NOTE — Telephone Encounter (Signed)
Note done and patient picked up this AM.
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
## 2017-11-07 NOTE — Procedures (Signed)
Lumbosacral Transforaminal Epidural Steroid Injection - Sub-Pedicular Approach with Fluoroscopic Guidance  Patient: [name]      Date of Birth: 04/28/1965 MRN: [id number] PCP: Patient, No Pcp Per      Visit Date: 10/22/2017   Universal Protocol:    Date/Time: 10/22/2017  Consent Given By: the patient  Position: PRONE  Additional Comments: **There was something with ordering the procedure through epic where there was also an order for an outside epidural injection and this was deleted and somehow inadvertently deleted the order for today's epidural.  The date for the procedure is the same day as the date above which was the visit date.  Vital signs were monitored before and after the procedure. Patient was prepped and draped in the usual sterile fashion. The correct patient, procedure, and site was verified.   Injection Procedure Details:  Procedure Site One Meds Administered:  Meds ordered this encounter  Medications  . betamethasone acetate-betamethasone sodium phosphate (CELESTONE) injection 12 mg    Laterality: Right  Location/Site:  L4-L5  Needle size: 22 G  Needle type: Spinal  Needle Placement: Transforaminal  Findings:    -Comments: Excellent flow of contrast along the nerve and into the epidural space.  Procedure Details: After squaring off the end-plates to get a true AP view, the C-arm was positioned so that an oblique view of the foramen as noted above was visualized. The target area is just inferior to the "nose of the scotty dog" or sub pedicular. The soft tissues overlying this structure were infiltrated with 2-3 ml. of 1% Lidocaine without Epinephrine.  The spinal needle was inserted toward the target using a "trajectory" view along the fluoroscope beam.  Under AP and lateral visualization, the needle was advanced so it did not puncture dura and was located close the 6 O'Clock position of the pedical in AP tracterory. Biplanar projections were used  to confirm position. Aspiration was confirmed to be negative for CSF and/or blood. A 1-2 ml. volume of Isovue-250 was injected and flow of contrast was noted at each level. Radiographs were obtained for documentation purposes.   After attaining the desired flow of contrast documented above, a 0.5 to 1.0 ml test dose of 0.25% Marcaine was injected into each respective transforaminal space.  The patient was observed for 90 seconds post injection.  After no sensory deficits were reported, and normal lower extremity motor function was noted,   the above injectate was administered so that equal amounts of the injectate were placed at each foramen (level) into the transforaminal epidural space.   Additional Comments:  The patient tolerated the procedure well Dressing: Band-Aid    Post-procedure details: Patient was observed during the procedure. Post-procedure instructions were reviewed.  Patient left the clinic in stable condition.

## 2017-11-12 ENCOUNTER — Encounter (INDEPENDENT_AMBULATORY_CARE_PROVIDER_SITE_OTHER): Payer: Self-pay | Admitting: Physician Assistant

## 2017-11-12 ENCOUNTER — Ambulatory Visit (INDEPENDENT_AMBULATORY_CARE_PROVIDER_SITE_OTHER): Payer: No Typology Code available for payment source | Admitting: Physician Assistant

## 2017-11-12 ENCOUNTER — Encounter (INDEPENDENT_AMBULATORY_CARE_PROVIDER_SITE_OTHER): Payer: Self-pay

## 2017-11-12 DIAGNOSIS — M5441 Lumbago with sciatica, right side: Secondary | ICD-10-CM | POA: Insufficient documentation

## 2017-11-12 DIAGNOSIS — M5442 Lumbago with sciatica, left side: Secondary | ICD-10-CM | POA: Diagnosis not present

## 2017-11-12 NOTE — Addendum Note (Signed)
Addended byPrescott Parma: [name] on: 11/12/2017 10:43 AM   Modules accepted: Orders
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
## 2017-11-15 NOTE — Telephone Encounter (Signed)
This encounter was created in error - please disregard.

## 2017-12-03 ENCOUNTER — Ambulatory Visit (INDEPENDENT_AMBULATORY_CARE_PROVIDER_SITE_OTHER): Payer: No Typology Code available for payment source | Admitting: Physical Medicine and Rehabilitation

## 2017-12-03 ENCOUNTER — Ambulatory Visit (INDEPENDENT_AMBULATORY_CARE_PROVIDER_SITE_OTHER): Payer: Self-pay

## 2017-12-03 ENCOUNTER — Telehealth (INDEPENDENT_AMBULATORY_CARE_PROVIDER_SITE_OTHER): Payer: Self-pay | Admitting: *Deleted

## 2017-12-03 ENCOUNTER — Encounter (INDEPENDENT_AMBULATORY_CARE_PROVIDER_SITE_OTHER): Payer: Self-pay | Admitting: Physical Medicine and Rehabilitation

## 2017-12-03 VITALS — BP 161/104 | HR 69 | Temp 98.3°F

## 2017-12-03 DIAGNOSIS — M5116 Intervertebral disc disorders with radiculopathy, lumbar region: Secondary | ICD-10-CM

## 2017-12-03 DIAGNOSIS — M48062 Spinal stenosis, lumbar region with neurogenic claudication: Secondary | ICD-10-CM

## 2017-12-03 DIAGNOSIS — M5416 Radiculopathy, lumbar region: Secondary | ICD-10-CM

## 2017-12-03 MED ORDER — BETAMETHASONE SOD PHOS & ACET 6 (3-3) MG/ML IJ SUSP
12.0000 mg | Freq: Once | INTRAMUSCULAR | Status: AC
  Administered 2017-12-03: 12 mg

## 2017-12-03 MED ORDER — IBUPROFEN 800 MG PO TABS: 800.0000 mg | ORAL_TABLET | Freq: Three times a day (TID) | ORAL | 3 refills | Status: DC | PRN

## 2017-12-03 NOTE — Telephone Encounter (Signed)
He needs to be seen in follow-up.  I sent in the 800 mg ibuprofen

## 2017-12-03 NOTE — Telephone Encounter (Signed)
Can we make an appointment for him next available since he is having new pain please

## 2017-12-03 NOTE — Patient Instructions (Signed)

## 2017-12-03 NOTE — Progress Notes (Signed)
 .  Numeric Pain Rating Scale and Functional Assessment Average Pain 8   In the last MONTH (on 0-10 scale) has pain interfered with the following?  1. General activity like being  able to carry out your everyday physical activities such as walking, climbing stairs, carrying groceries, or moving a chair?  Rating(5)   +Driver, -BT, -Dye Allergies.  

## 2017-12-03 NOTE — Telephone Encounter (Signed)
That will be fine. 

## 2017-12-03 NOTE — Telephone Encounter (Signed)
Please advise 

## 2017-12-03 NOTE — Procedures (Signed)
Lumbosacral Transforaminal Epidural Steroid Injection - Sub-Pedicular Approach with Fluoroscopic Guidance  Patient: Jonathan FiddlerHorace L Debella      Date of Birth: [birth year] MRN: [id number] PCP: Patient, No Pcp Per      Visit Date: 12/03/2017   Universal Protocol:    Date/Time: 12/03/2017  Consent Given By: the patient  Position: PRONE  Additional Comments: Vital signs were monitored before and after the procedure. Patient was prepped and draped in the usual sterile fashion. The correct patient, procedure, and site was verified.   Injection Procedure Details:  Procedure Site One Meds Administered:  Meds ordered this encounter  Medications  . betamethasone acetate-betamethasone sodium phosphate (CELESTONE) injection 12 mg    Laterality: Left  Location/Site:  L5-S1  Needle size: 22 G  Needle type: Spinal  Needle Placement: Transforaminal  Findings:    -Comments: Excellent flow of contrast along the nerve and into the epidural space.  Procedure Details: After squaring off the end-plates to get a true AP view, the C-arm was positioned so that an oblique view of the foramen as noted above was visualized. The target area is just inferior to the "nose of the scotty dog" or sub pedicular. The soft tissues overlying this structure were infiltrated with 2-3 ml. of 1% Lidocaine without Epinephrine.  The spinal needle was inserted toward the target using a "trajectory" view along the fluoroscope beam.  Under AP and lateral visualization, the needle was advanced so it did not puncture dura and was located close the 6 O'Clock position of the pedical in AP tracterory. Biplanar projections were used to confirm position. Aspiration was confirmed to be negative for CSF and/or blood. A 1-2 ml. volume of Isovue-250 was injected and flow of contrast was noted at each level. Radiographs were obtained for documentation purposes.   After attaining the desired flow of contrast documented above, a 0.5 to  1.0 ml test dose of 0.25% Marcaine was injected into each respective transforaminal space.  The patient was observed for 90 seconds post injection.  After no sensory deficits were reported, and normal lower extremity motor function was noted,   the above injectate was administered so that equal amounts of the injectate were placed at each foramen (level) into the transforaminal epidural space.   Additional Comments:  The patient tolerated the procedure well Dressing: Band-Aid    Post-procedure details: Patient was observed during the procedure. Post-procedure instructions were reviewed.  Patient left the clinic in stable condition.

## 2017-12-03 NOTE — Telephone Encounter (Signed)
Can we make an appointment for him next available since he is having new pain please  
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
## 2017-12-10 ENCOUNTER — Telehealth (INDEPENDENT_AMBULATORY_CARE_PROVIDER_SITE_OTHER): Payer: Self-pay

## 2017-12-10 NOTE — Telephone Encounter (Signed)
Emailed the 12/03/17 office note to adj per her request

## 2017-12-11 NOTE — Telephone Encounter (Signed)
Patient is scheduled 12/17/17 at 10:00am

## 2017-12-17 ENCOUNTER — Encounter (INDEPENDENT_AMBULATORY_CARE_PROVIDER_SITE_OTHER): Payer: Self-pay | Admitting: Physician Assistant

## 2017-12-17 ENCOUNTER — Ambulatory Visit (INDEPENDENT_AMBULATORY_CARE_PROVIDER_SITE_OTHER): Payer: No Typology Code available for payment source | Admitting: Physician Assistant

## 2017-12-17 DIAGNOSIS — M5442 Lumbago with sciatica, left side: Secondary | ICD-10-CM

## 2017-12-17 DIAGNOSIS — M5441 Lumbago with sciatica, right side: Secondary | ICD-10-CM | POA: Diagnosis not present
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
## 2018-01-14 ENCOUNTER — Ambulatory Visit (INDEPENDENT_AMBULATORY_CARE_PROVIDER_SITE_OTHER): Payer: Self-pay | Admitting: Physician Assistant

## 2018-01-14 ENCOUNTER — Ambulatory Visit (INDEPENDENT_AMBULATORY_CARE_PROVIDER_SITE_OTHER): Payer: No Typology Code available for payment source | Admitting: Orthopaedic Surgery

## 2018-01-14 ENCOUNTER — Encounter (INDEPENDENT_AMBULATORY_CARE_PROVIDER_SITE_OTHER): Payer: Self-pay | Admitting: Orthopaedic Surgery

## 2018-01-14 DIAGNOSIS — M5441 Lumbago with sciatica, right side: Secondary | ICD-10-CM | POA: Diagnosis not present

## 2018-01-14 DIAGNOSIS — M5442 Lumbago with sciatica, left side: Secondary | ICD-10-CM
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
## 2018-01-28 ENCOUNTER — Telehealth (INDEPENDENT_AMBULATORY_CARE_PROVIDER_SITE_OTHER): Payer: Self-pay

## 2018-01-28 NOTE — Telephone Encounter (Signed)
Emailed the 01/14/18 office note to wc adj per her request

## 2018-02-11 ENCOUNTER — Ambulatory Visit (INDEPENDENT_AMBULATORY_CARE_PROVIDER_SITE_OTHER): Payer: No Typology Code available for payment source | Admitting: Orthopaedic Surgery

## 2018-02-11 ENCOUNTER — Encounter (INDEPENDENT_AMBULATORY_CARE_PROVIDER_SITE_OTHER): Payer: Self-pay | Admitting: Orthopaedic Surgery

## 2018-02-11 DIAGNOSIS — M5441 Lumbago with sciatica, right side: Secondary | ICD-10-CM | POA: Diagnosis not present

## 2018-02-11 DIAGNOSIS — M5442 Lumbago with sciatica, left side: Secondary | ICD-10-CM

## 2018-02-11 MED ORDER — IBUPROFEN 800 MG PO TABS: 800.0000 mg | ORAL_TABLET | Freq: Three times a day (TID) | ORAL | 3 refills | Status: DC | PRN

## 2018-02-11 NOTE — Progress Notes (Signed)
The patient is continue to follow-up from a back injury and back strain related to an injury that occurred on the job.  He has been through significant and extensive physical therapy.  He has been on light duty work.  He says his supervisor is done a great job with working with him making sure that he is not overdoing anything at all.  He feels like he is back to his baseline.  With the physical demand type of job he does he feels like he has been to have some back issues here and there.  On exam he has excellent mobility of the lumbar spine with some pain in the paraspinal muscles.  There is no sciatic symptoms today he is got excellent strength in his bilateral lower extremities.  At this point he will follow-up as needed.  He would like to continue 800 mg ibuprofen as needed and I sent in prescription for this.  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  He can work at a medium physical demand level.  He feels he can occasionally lift up to 80 pounds, and this is done with assistance.  Again he believes his supervisor will work with him so he can still manage his day-to-day job.  He is otherwise released to full activities at this point.  A permanent partial disability rating the lumbar spine can be assigned to 10%.  This takes in consideration that his back issues were not as significant before he was hurt on the job and it certainly he is aggravated a pre-existing condition of stenosis and facet arthritis.

## 2018-04-29 ENCOUNTER — Ambulatory Visit (INDEPENDENT_AMBULATORY_CARE_PROVIDER_SITE_OTHER): Payer: No Typology Code available for payment source | Admitting: Physician Assistant

## 2018-04-29 ENCOUNTER — Encounter (INDEPENDENT_AMBULATORY_CARE_PROVIDER_SITE_OTHER): Payer: Self-pay | Admitting: Physician Assistant

## 2018-04-29 DIAGNOSIS — M5441 Lumbago with sciatica, right side: Secondary | ICD-10-CM | POA: Diagnosis not present

## 2018-04-29 DIAGNOSIS — M5442 Lumbago with sciatica, left side: Secondary | ICD-10-CM

## 2018-04-29 MED ORDER — MELOXICAM 15 MG PO TABS: 15.0000 mg | ORAL_TABLET | Freq: Every day | ORAL | 0 refills | Status: DC

## 2018-04-29 MED ORDER — METHYLPREDNISOLONE 4 MG PO TABS: ORAL_TABLET | ORAL | 0 refills | Status: DC

## 2018-04-29 MED ORDER — CYCLOBENZAPRINE HCL 10 MG PO TABS: 10.0000 mg | ORAL_TABLET | Freq: Three times a day (TID) | ORAL | 1 refills | Status: DC | PRN
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
## 2018-05-02 ENCOUNTER — Telehealth (INDEPENDENT_AMBULATORY_CARE_PROVIDER_SITE_OTHER): Payer: Self-pay

## 2018-05-02 NOTE — Telephone Encounter (Signed)
Faxed the 04/29/18 office note to the adjustor per her request

## 2018-05-13 ENCOUNTER — Encounter (INDEPENDENT_AMBULATORY_CARE_PROVIDER_SITE_OTHER): Payer: Self-pay

## 2018-05-13 ENCOUNTER — Encounter (INDEPENDENT_AMBULATORY_CARE_PROVIDER_SITE_OTHER): Payer: Self-pay | Admitting: Physician Assistant

## 2018-05-13 ENCOUNTER — Ambulatory Visit (INDEPENDENT_AMBULATORY_CARE_PROVIDER_SITE_OTHER): Payer: No Typology Code available for payment source | Admitting: Physician Assistant

## 2018-05-13 DIAGNOSIS — M5441 Lumbago with sciatica, right side: Secondary | ICD-10-CM | POA: Diagnosis not present

## 2018-05-13 DIAGNOSIS — M5442 Lumbago with sciatica, left side: Secondary | ICD-10-CM | POA: Diagnosis not present

## 2018-05-13 NOTE — Progress Notes (Signed)
HPI: Mr. Savannah returns today for follow-up of his low back pain.  He states he was better whenever he is on a Medrol Dosepak.  He is having shooting pain down both legs right leg it goes down the leg calf but not into the foot.  Left leg pain radiates down the lateral aspect of the left leg into the bottom of the left foot.  He does note that he had some depression on the Dosepak no suicidal ideology.  He does not want any type of surgical intervention he like to try another round of injections with Dr. Alvester Morin he had a right L4 transforaminal injection and a left L4 transforaminal injection.   Review of systems: See HPI otherwise negative  Physical exam: General well-developed well-nourished male no acute distress mood affect appropriate  Lower extremities: He has 5 out of 5 strength throughout the lower extremities against resistance.  Positive straight leg raise bilaterally.  Impression: Severe spinal stenosis and bilateral recess stenosis at L4-5.  Plan: He will return back to work tomorrow light duties only.  We will send him back to Dr. Alvester Morin for another round of epidural steroid injections.  However if he fails this or if short-lived would recommend that we refer him to a spinal surgeon.  Questions are encouraged and answered at length.  We will see him back in 1 month

## 2018-05-13 NOTE — Addendum Note (Signed)
Addended by: Rip Harbour on: 05/13/2018 11:04 AM   Modules accepted: Orders

## 2018-05-15 ENCOUNTER — Telehealth (INDEPENDENT_AMBULATORY_CARE_PROVIDER_SITE_OTHER): Payer: Self-pay | Admitting: Physician Assistant

## 2018-05-15 NOTE — Telephone Encounter (Signed)
Patient called stated job will not except note for absence.   Please call patient to get specifics that employer requires.

## 2018-05-15 NOTE — Telephone Encounter (Signed)
IC patient. Patient states job would not accept note with restrictions. He is needing note stating that he needs to be out of work until he gets his injections done by Dr Alvester Morin, however, he has not yet scheduled for injections with Dr Alvester Morin. Ok for note keeping him out of work?

## 2018-05-15 NOTE — Telephone Encounter (Signed)
Call pt 

## 2018-05-15 NOTE — Telephone Encounter (Signed)
oow work ok

## 2018-05-16 NOTE — Telephone Encounter (Signed)
IC advised could pick up at front desk.  

## 2018-05-27 ENCOUNTER — Telehealth (INDEPENDENT_AMBULATORY_CARE_PROVIDER_SITE_OTHER): Payer: Self-pay | Admitting: Physician Assistant

## 2018-05-27 NOTE — Telephone Encounter (Signed)
Returned call to patient left message to return call concerning needing an injection

## 2018-05-30 ENCOUNTER — Other Ambulatory Visit (INDEPENDENT_AMBULATORY_CARE_PROVIDER_SITE_OTHER): Payer: Self-pay | Admitting: Physician Assistant

## 2018-06-24 ENCOUNTER — Other Ambulatory Visit (INDEPENDENT_AMBULATORY_CARE_PROVIDER_SITE_OTHER): Payer: Self-pay | Admitting: Orthopaedic Surgery

## 2018-07-19 DIAGNOSIS — R945 Abnormal results of liver function studies: Secondary | ICD-10-CM

## 2018-07-19 DIAGNOSIS — R7989 Other specified abnormal findings of blood chemistry: Secondary | ICD-10-CM

## 2018-07-19 DIAGNOSIS — N179 Acute kidney failure, unspecified: Secondary | ICD-10-CM

## 2018-07-19 HISTORY — DX: Other specified abnormal findings of blood chemistry: R79.89

## 2018-07-19 HISTORY — DX: Abnormal results of liver function studies: R94.5

## 2018-07-19 HISTORY — DX: Acute kidney failure, unspecified: N17.9

## 2018-07-29 ENCOUNTER — Other Ambulatory Visit: Payer: Self-pay

## 2018-07-29 ENCOUNTER — Encounter (HOSPITAL_BASED_OUTPATIENT_CLINIC_OR_DEPARTMENT_OTHER): Payer: Self-pay | Admitting: *Deleted

## 2018-07-29 ENCOUNTER — Emergency Department (HOSPITAL_BASED_OUTPATIENT_CLINIC_OR_DEPARTMENT_OTHER)
Admission: EM | Admit: 2018-07-29 | Discharge: 2018-07-29 | Disposition: A | Discharge status: Home / Self Care | Payer: BLUE CROSS/BLUE SHIELD | Attending: Emergency Medicine | Admitting: Emergency Medicine

## 2018-07-29 DIAGNOSIS — Z79899 Other long term (current) drug therapy: Secondary | ICD-10-CM | POA: Diagnosis not present

## 2018-07-29 DIAGNOSIS — H538 Other visual disturbances: Secondary | ICD-10-CM | POA: Diagnosis not present

## 2018-07-29 DIAGNOSIS — E119 Type 2 diabetes mellitus without complications: Secondary | ICD-10-CM | POA: Diagnosis not present

## 2018-07-29 DIAGNOSIS — I10 Essential (primary) hypertension: Secondary | ICD-10-CM

## 2018-07-29 LAB — CBC WITH DIFFERENTIAL/PLATELET
Abs Immature Granulocytes: 0.01 10*3/uL (ref 0.00–0.07)
Basophils Absolute: 0 10*3/uL (ref 0.0–0.1)
Basophils Relative: 0 %
Eosinophils Absolute: 0.2 10*3/uL (ref 0.0–0.5)
Eosinophils Relative: 3 %
HCT: 42.6 % (ref 39.0–52.0)
Hemoglobin: 13.9 g/dL (ref 13.0–17.0)
Immature Granulocytes: 0 %
Lymphocytes Relative: 41 %
Lymphs Abs: 3.2 10*3/uL (ref 0.7–4.0)
MCH: 26.8 pg (ref 26.0–34.0)
MCHC: 32.6 g/dL (ref 30.0–36.0)
MCV: 82.2 fL (ref 80.0–100.0)
Monocytes Absolute: 0.3 10*3/uL (ref 0.1–1.0)
Monocytes Relative: 4 %
Neutro Abs: 3.9 10*3/uL (ref 1.7–7.7)
Neutrophils Relative %: 52 %
Platelets: 169 10*3/uL (ref 150–400)
RBC: 5.18 MIL/uL (ref 4.22–5.81)
RDW: 12.6 % (ref 11.5–15.5)
WBC: 7.7 10*3/uL (ref 4.0–10.5)
nRBC: 0 % (ref 0.0–0.2)

## 2018-07-29 LAB — BASIC METABOLIC PANEL
Anion gap: 15 (ref 5–15)
BUN: 19 mg/dL (ref 6–20)
CO2: 21 mmol/L — ABNORMAL LOW (ref 22–32)
Calcium: 9.6 mg/dL (ref 8.9–10.3)
Chloride: 98 mmol/L (ref 98–111)
Creatinine, Ser: 1.19 mg/dL (ref 0.61–1.24)
GFR calc Af Amer: >60 mL/min (ref >60)
GFR calc non Af Amer: >60 mL/min (ref >60)
Glucose, Bld: 482 mg/dL — ABNORMAL HIGH (ref 70–99)
Potassium: 4.1 mmol/L (ref 3.5–5.1)
Sodium: 134 mmol/L — ABNORMAL LOW (ref 135–145)

## 2018-07-29 LAB — CBG MONITORING, ED
Glucose-Capillary: 498 mg/dL — ABNORMAL HIGH (ref 70–99)
Glucose-Capillary: 356 mg/dL — ABNORMAL HIGH (ref 70–99)

## 2018-07-29 MED ORDER — LISINOPRIL 20 MG PO TABS: 20.0000 mg | ORAL_TABLET | Freq: Every day | ORAL | 1 refills | Status: DC

## 2018-07-29 MED ORDER — METFORMIN HCL 500 MG PO TABS: 500.0000 mg | ORAL_TABLET | Freq: Two times a day (BID) | ORAL | 1 refills | Status: DC

## 2018-07-29 MED ORDER — SODIUM CHLORIDE 0.9 % IV BOLUS
2000.0000 mL | Freq: Once | INTRAVENOUS | Status: AC
  Administered 2018-07-29: 1000 mL via INTRAVENOUS

## 2018-07-29 MED ORDER — LIVING WELL WITH DIABETES BOOK
Status: AC
  Administered 2018-07-29: 10:00:00
  Filled 2018-07-29: qty 1

## 2018-07-29 MED FILL — metFORMIN HCL 500 MG TABS: 500 | 30 days supply | Qty: 60 | Fill #0
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
## 2018-07-29 NOTE — ED Triage Notes (Addendum)
Pt. Blurred vision that started one week ago in his left eye. States he has had numbness in his hands that is not new, but general weakness in bilateral hands is new over the past week. C/o increased urination. Denies a diabetic history. C/o dry mouth and drinking a lot. Denies any fevers. Pt. States he has lost 20 pounds in last 2 months.

## 2018-07-29 NOTE — Discharge Instructions (Signed)
Make sure you are trying to follow a diabetic diet and avoid unneeded carbohydrates and sugars.  Also start taking the blood pressure medication.  If your vision in the left eye gets worse or you lose vision you should tell your doctor immediately or return to the emergency room.  The diabetic medication you are getting today is called metformin.  It can cause abdominal pain and cramping when you first start taking it.  If the side effects are too severe you can decrease to 250 mg twice a day until your body gets used to it and then go up to 500 mg twice a day.

## 2018-07-29 NOTE — ED Notes (Signed)
Pt c/o increased thirst, blurred vision and increased wt loss

## 2018-08-02 ENCOUNTER — Inpatient Hospital Stay (HOSPITAL_BASED_OUTPATIENT_CLINIC_OR_DEPARTMENT_OTHER)
Admission: EM | Admit: 2018-08-02 | Discharge: 2018-08-05 | DRG: 638 | Disposition: A | Discharge status: Home / Self Care | Payer: BLUE CROSS/BLUE SHIELD | Attending: Family Medicine | Admitting: Family Medicine

## 2018-08-02 ENCOUNTER — Emergency Department (HOSPITAL_BASED_OUTPATIENT_CLINIC_OR_DEPARTMENT_OTHER): Payer: BLUE CROSS/BLUE SHIELD

## 2018-08-02 ENCOUNTER — Encounter: Payer: Self-pay | Admitting: Medical

## 2018-08-02 ENCOUNTER — Ambulatory Visit (INDEPENDENT_AMBULATORY_CARE_PROVIDER_SITE_OTHER): Payer: BLUE CROSS/BLUE SHIELD | Admitting: Medical

## 2018-08-02 ENCOUNTER — Encounter (HOSPITAL_BASED_OUTPATIENT_CLINIC_OR_DEPARTMENT_OTHER): Payer: Self-pay | Admitting: Family Medicine

## 2018-08-02 ENCOUNTER — Encounter (HOSPITAL_BASED_OUTPATIENT_CLINIC_OR_DEPARTMENT_OTHER): Payer: Self-pay | Admitting: Adult Health

## 2018-08-02 ENCOUNTER — Other Ambulatory Visit: Payer: Self-pay

## 2018-08-02 VITALS — Ht 74.0 in | Wt 247.0 lb

## 2018-08-02 DIAGNOSIS — I9589 Other hypotension: Secondary | ICD-10-CM

## 2018-08-02 DIAGNOSIS — F17220 Nicotine dependence, chewing tobacco, uncomplicated: Secondary | ICD-10-CM | POA: Diagnosis not present

## 2018-08-02 DIAGNOSIS — I483 Typical atrial flutter: Secondary | ICD-10-CM

## 2018-08-02 DIAGNOSIS — I159 Secondary hypertension, unspecified: Secondary | ICD-10-CM

## 2018-08-02 DIAGNOSIS — Z79890 Hormone replacement therapy (postmenopausal): Secondary | ICD-10-CM

## 2018-08-02 DIAGNOSIS — I959 Hypotension, unspecified: Secondary | ICD-10-CM | POA: Diagnosis not present

## 2018-08-02 DIAGNOSIS — N179 Acute kidney failure, unspecified: Secondary | ICD-10-CM | POA: Diagnosis present

## 2018-08-02 DIAGNOSIS — E1110 Type 2 diabetes mellitus with ketoacidosis without coma: Secondary | ICD-10-CM | POA: Diagnosis not present

## 2018-08-02 DIAGNOSIS — I4891 Unspecified atrial fibrillation: Secondary | ICD-10-CM | POA: Diagnosis not present

## 2018-08-02 DIAGNOSIS — Z20828 Contact with and (suspected) exposure to other viral communicable diseases: Secondary | ICD-10-CM | POA: Diagnosis not present

## 2018-08-02 DIAGNOSIS — E1165 Type 2 diabetes mellitus with hyperglycemia: Secondary | ICD-10-CM

## 2018-08-02 DIAGNOSIS — R5383 Other fatigue: Secondary | ICD-10-CM

## 2018-08-02 DIAGNOSIS — Z6833 Body mass index (BMI) 33.0-33.9, adult: Secondary | ICD-10-CM | POA: Diagnosis not present

## 2018-08-02 DIAGNOSIS — E860 Dehydration: Secondary | ICD-10-CM | POA: Diagnosis present

## 2018-08-02 DIAGNOSIS — I4892 Unspecified atrial flutter: Secondary | ICD-10-CM | POA: Diagnosis not present

## 2018-08-02 DIAGNOSIS — M549 Dorsalgia, unspecified: Secondary | ICD-10-CM | POA: Diagnosis present

## 2018-08-02 DIAGNOSIS — Z791 Long term (current) use of non-steroidal anti-inflammatories (NSAID): Secondary | ICD-10-CM | POA: Diagnosis not present

## 2018-08-02 DIAGNOSIS — K760 Fatty (change of) liver, not elsewhere classified: Secondary | ICD-10-CM | POA: Diagnosis not present

## 2018-08-02 DIAGNOSIS — R740 Nonspecific elevation of levels of transaminase and lactic acid dehydrogenase [LDH]: Secondary | ICD-10-CM

## 2018-08-02 DIAGNOSIS — G8929 Other chronic pain: Secondary | ICD-10-CM | POA: Diagnosis present

## 2018-08-02 DIAGNOSIS — E6609 Other obesity due to excess calories: Secondary | ICD-10-CM | POA: Diagnosis present

## 2018-08-02 DIAGNOSIS — R112 Nausea with vomiting, unspecified: Secondary | ICD-10-CM | POA: Diagnosis not present

## 2018-08-02 DIAGNOSIS — Z888 Allergy status to other drugs, medicaments and biological substances status: Secondary | ICD-10-CM

## 2018-08-02 DIAGNOSIS — F1010 Alcohol abuse, uncomplicated: Secondary | ICD-10-CM

## 2018-08-02 DIAGNOSIS — R7989 Other specified abnormal findings of blood chemistry: Secondary | ICD-10-CM | POA: Diagnosis present

## 2018-08-02 DIAGNOSIS — E861 Hypovolemia: Secondary | ICD-10-CM | POA: Diagnosis not present

## 2018-08-02 DIAGNOSIS — Z79899 Other long term (current) drug therapy: Secondary | ICD-10-CM | POA: Diagnosis not present

## 2018-08-02 DIAGNOSIS — R109 Unspecified abdominal pain: Secondary | ICD-10-CM | POA: Diagnosis not present

## 2018-08-02 DIAGNOSIS — E0810 Diabetes mellitus due to underlying condition with ketoacidosis without coma: Secondary | ICD-10-CM

## 2018-08-02 DIAGNOSIS — M5441 Lumbago with sciatica, right side: Secondary | ICD-10-CM

## 2018-08-02 DIAGNOSIS — I10 Essential (primary) hypertension: Secondary | ICD-10-CM | POA: Diagnosis not present

## 2018-08-02 DIAGNOSIS — R945 Abnormal results of liver function studies: Secondary | ICD-10-CM | POA: Diagnosis not present

## 2018-08-02 DIAGNOSIS — K8020 Calculus of gallbladder without cholecystitis without obstruction: Secondary | ICD-10-CM | POA: Diagnosis not present

## 2018-08-02 DIAGNOSIS — R000 Tachycardia, unspecified: Secondary | ICD-10-CM | POA: Diagnosis not present

## 2018-08-02 HISTORY — DX: Type 2 diabetes mellitus without complications: E11.9

## 2018-08-02 LAB — URINALYSIS, ROUTINE W REFLEX MICROSCOPIC
Bacteria, UA: NONE SEEN
Bilirubin Urine: NEGATIVE
Glucose, UA: >=500 mg/dL — AB
Hgb urine dipstick: NEGATIVE
Ketones, ur: 80 mg/dL — AB
Leukocytes,Ua: NEGATIVE
Nitrite: NEGATIVE
Protein, ur: 30 mg/dL — AB
Specific Gravity, Urine: 1.016 (ref 1.005–1.030)
pH: 5 (ref 5.0–8.0)

## 2018-08-02 LAB — COMPREHENSIVE METABOLIC PANEL
ALT: 65 U/L — ABNORMAL HIGH (ref 0–44)
AST: 57 U/L — ABNORMAL HIGH (ref 15–41)
Albumin: 4.5 g/dL (ref 3.5–5.0)
Alkaline Phosphatase: 90 U/L (ref 38–126)
Anion gap: 20 — ABNORMAL HIGH (ref 5–15)
BUN: 25 mg/dL — ABNORMAL HIGH (ref 6–20)
CO2: 11 mmol/L — ABNORMAL LOW (ref 22–32)
Calcium: 9.3 mg/dL (ref 8.9–10.3)
Chloride: 100 mmol/L (ref 98–111)
Creatinine, Ser: 2.03 mg/dL — ABNORMAL HIGH (ref 0.61–1.24)
GFR calc Af Amer: 43 mL/min — ABNORMAL LOW (ref >60)
GFR calc non Af Amer: 37 mL/min — ABNORMAL LOW (ref >60)
Glucose, Bld: 508 mg/dL (ref 70–99)
Potassium: 4.2 mmol/L (ref 3.5–5.1)
Sodium: 131 mmol/L — ABNORMAL LOW (ref 135–145)
Total Bilirubin: 1.5 mg/dL — ABNORMAL HIGH (ref 0.3–1.2)
Total Protein: 8.2 g/dL — ABNORMAL HIGH (ref 6.5–8.1)

## 2018-08-02 LAB — TROPONIN I: Troponin I: <0.03 ng/mL (ref <0.03)

## 2018-08-02 LAB — CBC
HCT: 45.2 % (ref 39.0–52.0)
Hemoglobin: 14.3 g/dL (ref 13.0–17.0)
MCH: 26.9 pg (ref 26.0–34.0)
MCHC: 31.6 g/dL (ref 30.0–36.0)
MCV: 85 fL (ref 80.0–100.0)
Platelets: 207 10*3/uL (ref 150–400)
RBC: 5.32 MIL/uL (ref 4.22–5.81)
RDW: 13.3 % (ref 11.5–15.5)
WBC: 8.7 10*3/uL (ref 4.0–10.5)
nRBC: 0 % (ref 0.0–0.2)

## 2018-08-02 LAB — BASIC METABOLIC PANEL
Anion gap: 12 (ref 5–15)
Anion gap: 10 (ref 5–15)
BUN: 26 mg/dL — ABNORMAL HIGH (ref 6–20)
BUN: 24 mg/dL — ABNORMAL HIGH (ref 6–20)
CO2: 14 mmol/L — ABNORMAL LOW (ref 22–32)
CO2: 17 mmol/L — ABNORMAL LOW (ref 22–32)
Calcium: 8 mg/dL — ABNORMAL LOW (ref 8.9–10.3)
Calcium: 8.5 mg/dL — ABNORMAL LOW (ref 8.9–10.3)
Chloride: 109 mmol/L (ref 98–111)
Chloride: 111 mmol/L (ref 98–111)
Creatinine, Ser: 1.81 mg/dL — ABNORMAL HIGH (ref 0.61–1.24)
Creatinine, Ser: 1.45 mg/dL — ABNORMAL HIGH (ref 0.61–1.24)
GFR calc Af Amer: 49 mL/min — ABNORMAL LOW (ref >60)
GFR calc Af Amer: >60 mL/min (ref >60)
GFR calc non Af Amer: 43 mL/min — ABNORMAL LOW (ref >60)
GFR calc non Af Amer: 56 mL/min — ABNORMAL LOW (ref >60)
Glucose, Bld: 335 mg/dL — ABNORMAL HIGH (ref 70–99)
Glucose, Bld: 184 mg/dL — ABNORMAL HIGH (ref 70–99)
Potassium: 4.2 mmol/L (ref 3.5–5.1)
Potassium: 3.7 mmol/L (ref 3.5–5.1)
Sodium: 135 mmol/L (ref 135–145)
Sodium: 138 mmol/L (ref 135–145)

## 2018-08-02 LAB — CBG MONITORING, ED
Glucose-Capillary: 508 mg/dL (ref 70–99)
Glucose-Capillary: 353 mg/dL — ABNORMAL HIGH (ref 70–99)
Glucose-Capillary: 310 mg/dL — ABNORMAL HIGH (ref 70–99)
Glucose-Capillary: 257 mg/dL — ABNORMAL HIGH (ref 70–99)

## 2018-08-02 LAB — GLUCOSE, CAPILLARY
Glucose-Capillary: 230 mg/dL — ABNORMAL HIGH (ref 70–99)
Glucose-Capillary: 178 mg/dL — ABNORMAL HIGH (ref 70–99)
Glucose-Capillary: 141 mg/dL — ABNORMAL HIGH (ref 70–99)
Glucose-Capillary: 142 mg/dL — ABNORMAL HIGH (ref 70–99)
Glucose-Capillary: 130 mg/dL — ABNORMAL HIGH (ref 70–99)
Glucose-Capillary: 132 mg/dL — ABNORMAL HIGH (ref 70–99)
Glucose-Capillary: 174 mg/dL — ABNORMAL HIGH (ref 70–99)

## 2018-08-02 LAB — HEMOGLOBIN A1C
Hgb A1c MFr Bld: 13.7 % — ABNORMAL HIGH (ref 4.8–5.6)
Mean Plasma Glucose: 346.49 mg/dL

## 2018-08-02 LAB — SARS CORONAVIRUS 2 AG (30 MIN TAT): SARS Coronavirus 2 Ag: NEGATIVE

## 2018-08-02 LAB — TSH: TSH: 1.929 u[IU]/mL (ref 0.350–4.500)

## 2018-08-02 LAB — LIPASE, BLOOD: Lipase: 50 U/L (ref 11–51)

## 2018-08-02 LAB — PHOSPHORUS: Phosphorus: 2 mg/dL — ABNORMAL LOW (ref 2.5–4.6)

## 2018-08-02 LAB — MAGNESIUM: Magnesium: 2.3 mg/dL (ref 1.7–2.4)

## 2018-08-02 LAB — MRSA PCR SCREENING: MRSA by PCR: NEGATIVE

## 2018-08-02 MED ORDER — MAGNESIUM CITRATE PO SOLN: 1.0000 | Freq: Once | ORAL | Status: DC | PRN

## 2018-08-02 MED ORDER — DEXTROSE-NACL 5-0.45 % IV SOLN
INTRAVENOUS | Status: DC
  Administered 2018-08-02 – 2018-08-03 (×2): via INTRAVENOUS

## 2018-08-02 MED ORDER — DEXTROSE-NACL 5-0.45 % IV SOLN: INTRAVENOUS | Status: DC

## 2018-08-02 MED ORDER — KETOROLAC TROMETHAMINE 15 MG/ML IJ SOLN: 15.0000 mg | Freq: Four times a day (QID) | INTRAMUSCULAR | Status: DC | PRN

## 2018-08-02 MED ORDER — SENNOSIDES-DOCUSATE SODIUM 8.6-50 MG PO TABS
1.0000 | ORAL_TABLET | Freq: Every evening | ORAL | Status: DC | PRN
  Filled 2018-08-02: qty 1

## 2018-08-02 MED ORDER — SODIUM CHLORIDE 0.9% FLUSH
3.0000 mL | Freq: Two times a day (BID) | INTRAVENOUS | Status: DC
  Administered 2018-08-02 – 2018-08-05 (×6): 3 mL via INTRAVENOUS
  Filled 2018-08-02: qty 3

## 2018-08-02 MED ORDER — INSULIN REGULAR(HUMAN) IN NACL 100-0.9 UT/100ML-% IV SOLN
INTRAVENOUS | Status: DC
  Administered 2018-08-02: 13:00:00 4.5 [IU]/h via INTRAVENOUS

## 2018-08-02 MED ORDER — LEVALBUTEROL HCL 0.63 MG/3ML IN NEBU: 0.6300 mg | INHALATION_SOLUTION | Freq: Four times a day (QID) | RESPIRATORY_TRACT | Status: DC | PRN

## 2018-08-02 MED ORDER — METOPROLOL TARTRATE 25 MG PO TABS
25.0000 mg | ORAL_TABLET | Freq: Two times a day (BID) | ORAL | Status: DC
  Administered 2018-08-02 – 2018-08-04 (×4): 25 mg via ORAL
  Filled 2018-08-02 (×4): qty 1

## 2018-08-02 MED ORDER — INSULIN ASPART 100 UNIT/ML ~~LOC~~ SOLN
0.0000 [IU] | Freq: Every day | SUBCUTANEOUS | Status: DC
  Administered 2018-08-03: 4 [IU] via SUBCUTANEOUS
  Administered 2018-08-04: 3 [IU] via SUBCUTANEOUS

## 2018-08-02 MED ORDER — SODIUM CHLORIDE 0.9 % IV BOLUS
500.0000 mL | Freq: Once | INTRAVENOUS | Status: AC
  Administered 2018-08-02: 13:00:00 500 mL via INTRAVENOUS

## 2018-08-02 MED ORDER — SODIUM CHLORIDE 0.9 % IV SOLN
INTRAVENOUS | Status: DC
  Administered 2018-08-02: 14:00:00 via INTRAVENOUS

## 2018-08-02 MED ORDER — AMIODARONE IV BOLUS ONLY 150 MG/100ML
150.0000 mg | Freq: Once | INTRAVENOUS | Status: DC
  Filled 2018-08-02: qty 100

## 2018-08-02 MED ORDER — ONDANSETRON HCL 4 MG/2ML IJ SOLN: 4.0000 mg | Freq: Four times a day (QID) | INTRAMUSCULAR | Status: DC | PRN

## 2018-08-02 MED ORDER — INSULIN GLARGINE 100 UNIT/ML ~~LOC~~ SOLN
15.0000 [IU] | Freq: Every day | SUBCUTANEOUS | Status: DC
  Filled 2018-08-02 (×2): qty 0.15

## 2018-08-02 MED ORDER — ONDANSETRON HCL 4 MG/2ML IJ SOLN
4.0000 mg | Freq: Once | INTRAMUSCULAR | Status: AC
  Administered 2018-08-02: 4 mg via INTRAVENOUS
  Filled 2018-08-02: qty 2

## 2018-08-02 MED ORDER — PHENYLEPHRINE HCL-NACL 10-0.9 MG/250ML-% IV SOLN
INTRAVENOUS | Status: AC
  Filled 2018-08-02: qty 250

## 2018-08-02 MED ORDER — SORBITOL 70 % SOLN: 30.0000 mL | Freq: Every day | Status: DC | PRN

## 2018-08-02 MED ORDER — HEPARIN (PORCINE) 25000 UT/250ML-% IV SOLN
1500.0000 [IU]/h | INTRAVENOUS | Status: DC
  Administered 2018-08-02: 1500 [IU]/h via INTRAVENOUS
  Filled 2018-08-02: qty 250

## 2018-08-02 MED ORDER — ALBUTEROL SULFATE (2.5 MG/3ML) 0.083% IN NEBU: 2.5000 mg | INHALATION_SOLUTION | RESPIRATORY_TRACT | Status: DC | PRN

## 2018-08-02 MED ORDER — HEPARIN SODIUM (PORCINE) 5000 UNIT/ML IJ SOLN
5000.0000 [IU] | Freq: Three times a day (TID) | INTRAMUSCULAR | Status: DC
  Administered 2018-08-02: 5000 [IU] via SUBCUTANEOUS
  Filled 2018-08-02: qty 1

## 2018-08-02 MED ORDER — SODIUM CHLORIDE 0.9 % IV BOLUS
1000.0000 mL | Freq: Once | INTRAVENOUS | Status: AC
  Administered 2018-08-02: 1000 mL via INTRAVENOUS

## 2018-08-02 MED ORDER — HYDROCODONE-ACETAMINOPHEN 5-325 MG PO TABS: 1.0000 | ORAL_TABLET | ORAL | Status: DC | PRN

## 2018-08-02 MED ORDER — SODIUM CHLORIDE 0.9 % IV SOLN
INTRAVENOUS | Status: AC
  Administered 2018-08-02: 14:00:00 via INTRAVENOUS

## 2018-08-02 MED ORDER — ACETAMINOPHEN 325 MG PO TABS: 650.0000 mg | ORAL_TABLET | Freq: Four times a day (QID) | ORAL | Status: DC | PRN

## 2018-08-02 MED ORDER — HEPARIN SODIUM (PORCINE) 5000 UNIT/ML IJ SOLN: 5000.0000 [IU] | Freq: Three times a day (TID) | INTRAMUSCULAR | Status: DC

## 2018-08-02 MED ORDER — DOCUSATE SODIUM 100 MG PO CAPS
100.0000 mg | ORAL_CAPSULE | Freq: Two times a day (BID) | ORAL | Status: DC
  Administered 2018-08-02 – 2018-08-03 (×2): 100 mg via ORAL
  Filled 2018-08-02 (×6): qty 1

## 2018-08-02 MED ORDER — INSULIN REGULAR(HUMAN) IN NACL 100-0.9 UT/100ML-% IV SOLN
INTRAVENOUS | Status: DC
  Administered 2018-08-02: 5 [IU]/h via INTRAVENOUS
  Filled 2018-08-02 (×2): qty 100

## 2018-08-02 MED ORDER — OXYCODONE HCL 5 MG PO TABS: 10.0000 mg | ORAL_TABLET | ORAL | Status: DC | PRN

## 2018-08-02 MED ORDER — CHLORHEXIDINE GLUCONATE CLOTH 2 % EX PADS
6.0000 | MEDICATED_PAD | Freq: Every day | CUTANEOUS | Status: DC
  Administered 2018-08-02 – 2018-08-03 (×2): 6 via TOPICAL

## 2018-08-02 MED ORDER — TAMSULOSIN HCL 0.4 MG PO CAPS
0.4000 mg | ORAL_CAPSULE | ORAL | Status: DC
  Administered 2018-08-02 – 2018-08-04 (×3): 0.4 mg via ORAL
  Filled 2018-08-02 (×3): qty 1

## 2018-08-02 MED ORDER — INSULIN REGULAR(HUMAN) IN NACL 100-0.9 UT/100ML-% IV SOLN
INTRAVENOUS | Status: AC
  Administered 2018-08-02: 4.5 [IU]/h via INTRAVENOUS
  Filled 2018-08-02: qty 100

## 2018-08-02 MED ORDER — POTASSIUM CHLORIDE 10 MEQ/100ML IV SOLN
10.0000 meq | INTRAVENOUS | Status: AC
  Administered 2018-08-02 (×2): 10 meq via INTRAVENOUS
  Filled 2018-08-02 (×2): qty 100

## 2018-08-02 MED ORDER — ACETAMINOPHEN 650 MG RE SUPP: 650.0000 mg | Freq: Four times a day (QID) | RECTAL | Status: DC | PRN

## 2018-08-02 MED ORDER — ONDANSETRON HCL 4 MG PO TABS: 4.0000 mg | ORAL_TABLET | Freq: Four times a day (QID) | ORAL | Status: DC | PRN

## 2018-08-02 NOTE — ED Notes (Signed)
Report given to [facility]. 25 minute ETA.
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
## 2018-08-02 NOTE — Patient Instructions (Signed)
Patient has history of recent diagnosis of diabetes and hypertension.  Sugars were in the 400 range earlier in the week in the emergency department.  He states that since he started metformin he has been vomiting daily and not been able to eat much at all.  He was unable check his blood pressure a day since he does not have a machine at home.  He also reports moderate to severe fatigue and started vomiting during the phone visit.  It is unclear how long he has been a diabetic but considering that he might have some gastroparesis.  Also based on his recent alcohol consumption consider that he might have some pancreatitis.  I tried to arrange for him to get labs done through our lab today but the lab schedule was booked.  We have some limits recently on number of patients that can be seen in lab due to the COVID pandemic.  In the end decided it was best for patient to be evaluated in the emergency department at the med center again under the circumstances.  Explained to patient and his fiance and they agreed that he would go to emergency department.  I did call and speak with emergency department physician and explained his recent signs symptoms since discharge from the emergency department.
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
## 2018-08-02 NOTE — ED Notes (Signed)
Pt on monitor 
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
## 2018-08-02 NOTE — ED Triage Notes (Signed)
Pt began metformin on Monday, he is taking 500mg  twice a day. Since starting he has been vomitign everything up and has not been able to hold anything down. His HR is 131. They also began him on Lisinopril for HTN. His BP is 85/61 at this time. He denies diarrhea

## 2018-08-02 NOTE — Progress Notes (Signed)
Upon arrival, patient in a.fib/a.flutter, attending MD made aware, cardiologist consulted. New orders received. Will continue to monitor.

## 2018-08-03 ENCOUNTER — Inpatient Hospital Stay (HOSPITAL_COMMUNITY): Payer: BLUE CROSS/BLUE SHIELD

## 2018-08-03 DIAGNOSIS — I4891 Unspecified atrial fibrillation: Secondary | ICD-10-CM

## 2018-08-03 LAB — BASIC METABOLIC PANEL
Anion gap: 10 (ref 5–15)
Anion gap: 9 (ref 5–15)
Anion gap: 9 (ref 5–15)
Anion gap: 8 (ref 5–15)
BUN: 21 mg/dL — ABNORMAL HIGH (ref 6–20)
BUN: 20 mg/dL (ref 6–20)
BUN: 20 mg/dL (ref 6–20)
BUN: 21 mg/dL — ABNORMAL HIGH (ref 6–20)
CO2: 15 mmol/L — ABNORMAL LOW (ref 22–32)
CO2: 16 mmol/L — ABNORMAL LOW (ref 22–32)
CO2: 17 mmol/L — ABNORMAL LOW (ref 22–32)
CO2: 16 mmol/L — ABNORMAL LOW (ref 22–32)
Calcium: 8.3 mg/dL — ABNORMAL LOW (ref 8.9–10.3)
Calcium: 8.3 mg/dL — ABNORMAL LOW (ref 8.9–10.3)
Calcium: 8.4 mg/dL — ABNORMAL LOW (ref 8.9–10.3)
Calcium: 8.3 mg/dL — ABNORMAL LOW (ref 8.9–10.3)
Chloride: 112 mmol/L — ABNORMAL HIGH (ref 98–111)
Chloride: 112 mmol/L — ABNORMAL HIGH (ref 98–111)
Chloride: 111 mmol/L (ref 98–111)
Chloride: 109 mmol/L (ref 98–111)
Creatinine, Ser: 1.2 mg/dL (ref 0.61–1.24)
Creatinine, Ser: 1.11 mg/dL (ref 0.61–1.24)
Creatinine, Ser: 1.14 mg/dL (ref 0.61–1.24)
Creatinine, Ser: 1.09 mg/dL (ref 0.61–1.24)
GFR calc Af Amer: >60 mL/min (ref >60)
GFR calc Af Amer: >60 mL/min (ref >60)
GFR calc Af Amer: >60 mL/min (ref >60)
GFR calc Af Amer: >60 mL/min (ref >60)
GFR calc non Af Amer: >60 mL/min (ref >60)
GFR calc non Af Amer: >60 mL/min (ref >60)
GFR calc non Af Amer: >60 mL/min (ref >60)
GFR calc non Af Amer: >60 mL/min (ref >60)
Glucose, Bld: 172 mg/dL — ABNORMAL HIGH (ref 70–99)
Glucose, Bld: 199 mg/dL — ABNORMAL HIGH (ref 70–99)
Glucose, Bld: 155 mg/dL — ABNORMAL HIGH (ref 70–99)
Glucose, Bld: 337 mg/dL — ABNORMAL HIGH (ref 70–99)
Potassium: 3.6 mmol/L (ref 3.5–5.1)
Potassium: 3.4 mmol/L — ABNORMAL LOW (ref 3.5–5.1)
Potassium: 3.6 mmol/L (ref 3.5–5.1)
Potassium: 4.6 mmol/L (ref 3.5–5.1)
Sodium: 137 mmol/L (ref 135–145)
Sodium: 137 mmol/L (ref 135–145)
Sodium: 137 mmol/L (ref 135–145)
Sodium: 133 mmol/L — ABNORMAL LOW (ref 135–145)

## 2018-08-03 LAB — GLUCOSE, CAPILLARY
Glucose-Capillary: 154 mg/dL — ABNORMAL HIGH (ref 70–99)
Glucose-Capillary: 155 mg/dL — ABNORMAL HIGH (ref 70–99)
Glucose-Capillary: 157 mg/dL — ABNORMAL HIGH (ref 70–99)
Glucose-Capillary: 168 mg/dL — ABNORMAL HIGH (ref 70–99)
Glucose-Capillary: 171 mg/dL — ABNORMAL HIGH (ref 70–99)
Glucose-Capillary: 171 mg/dL — ABNORMAL HIGH (ref 70–99)
Glucose-Capillary: 174 mg/dL — ABNORMAL HIGH (ref 70–99)
Glucose-Capillary: 174 mg/dL — ABNORMAL HIGH (ref 70–99)
Glucose-Capillary: 184 mg/dL — ABNORMAL HIGH (ref 70–99)
Glucose-Capillary: 185 mg/dL — ABNORMAL HIGH (ref 70–99)
Glucose-Capillary: 187 mg/dL — ABNORMAL HIGH (ref 70–99)
Glucose-Capillary: 245 mg/dL — ABNORMAL HIGH (ref 70–99)
Glucose-Capillary: 251 mg/dL — ABNORMAL HIGH (ref 70–99)
Glucose-Capillary: 330 mg/dL — ABNORMAL HIGH (ref 70–99)

## 2018-08-03 LAB — CBC
HCT: 38.7 % — ABNORMAL LOW (ref 39.0–52.0)
Hemoglobin: 12.2 g/dL — ABNORMAL LOW (ref 13.0–17.0)
MCH: 26.8 pg (ref 26.0–34.0)
MCHC: 31.5 g/dL (ref 30.0–36.0)
MCV: 85.1 fL (ref 80.0–100.0)
Platelets: 148 10*3/uL — ABNORMAL LOW (ref 150–400)
RBC: 4.55 MIL/uL (ref 4.22–5.81)
RDW: 13.4 % (ref 11.5–15.5)
WBC: 6.9 10*3/uL (ref 4.0–10.5)
nRBC: 0 % (ref 0.0–0.2)

## 2018-08-03 LAB — HEPARIN LEVEL (UNFRACTIONATED)
Heparin Unfractionated: 0.53 IU/mL (ref 0.30–0.70)
Heparin Unfractionated: 0.62 IU/mL (ref 0.30–0.70)

## 2018-08-03 LAB — PROTIME-INR
INR: 1.1 (ref 0.8–1.2)
Prothrombin Time: 14.3 seconds (ref 11.4–15.2)

## 2018-08-03 LAB — APTT: aPTT: 107 seconds — ABNORMAL HIGH (ref 24–36)

## 2018-08-03 LAB — HIV ANTIBODY (ROUTINE TESTING W REFLEX): HIV Screen 4th Generation wRfx: NONREACTIVE

## 2018-08-03 LAB — TROPONIN I
Troponin I: <0.03 ng/mL (ref <0.03)
Troponin I: <0.03 ng/mL (ref <0.03)

## 2018-08-03 MED ORDER — ALUM & MAG HYDROXIDE-SIMETH 200-200-20 MG/5ML PO SUSP
15.0000 mL | ORAL | Status: DC | PRN
  Administered 2018-08-03: 15 mL via ORAL
  Filled 2018-08-03: qty 30

## 2018-08-03 MED ORDER — POTASSIUM CHLORIDE IN NACL 40-0.9 MEQ/L-% IV SOLN
INTRAVENOUS | Status: DC
  Administered 2018-08-03 – 2018-08-04 (×2): 75 mL/h via INTRAVENOUS
  Filled 2018-08-03 (×2): qty 1000

## 2018-08-03 MED ORDER — INSULIN GLARGINE 100 UNIT/ML ~~LOC~~ SOLN: 15.0000 [IU] | Freq: Once | SUBCUTANEOUS | Status: DC

## 2018-08-03 MED ORDER — APIXABAN 5 MG PO TABS
5.0000 mg | ORAL_TABLET | Freq: Two times a day (BID) | ORAL | Status: DC
  Administered 2018-08-03 – 2018-08-05 (×5): 5 mg via ORAL
  Filled 2018-08-03 (×5): qty 1

## 2018-08-03 MED ORDER — POTASSIUM CHLORIDE CRYS ER 20 MEQ PO TBCR
40.0000 meq | EXTENDED_RELEASE_TABLET | Freq: Four times a day (QID) | ORAL | Status: AC
  Administered 2018-08-03 (×2): 40 meq via ORAL
  Filled 2018-08-03 (×2): qty 2

## 2018-08-03 MED ORDER — SODIUM CHLORIDE 0.9 % IV SOLN: INTRAVENOUS | Status: DC

## 2018-08-03 MED ORDER — APIXABAN 5 MG PO TABS: 10.0000 mg | ORAL_TABLET | Freq: Two times a day (BID) | ORAL | Status: DC

## 2018-08-03 MED ORDER — APIXABAN 5 MG PO TABS: 5.0000 mg | ORAL_TABLET | Freq: Two times a day (BID) | ORAL | Status: DC

## 2018-08-03 MED ORDER — INSULIN GLARGINE 100 UNIT/ML ~~LOC~~ SOLN
15.0000 [IU] | Freq: Once | SUBCUTANEOUS | Status: AC
  Administered 2018-08-03: 15 [IU] via SUBCUTANEOUS
  Filled 2018-08-03: qty 0.15

## 2018-08-03 MED ORDER — LIVING WELL WITH DIABETES BOOK
Freq: Once | Status: AC
  Administered 2018-08-03: 18:00:00
  Filled 2018-08-03: qty 1

## 2018-08-03 MED ORDER — INSULIN STARTER KIT- PEN NEEDLES (ENGLISH)
1.0000 | Freq: Once | Status: DC
  Filled 2018-08-03: qty 1

## 2018-08-03 MED ORDER — INSULIN ASPART 100 UNIT/ML ~~LOC~~ SOLN
0.0000 [IU] | Freq: Three times a day (TID) | SUBCUTANEOUS | Status: DC
  Administered 2018-08-03: 8 [IU] via SUBCUTANEOUS
  Administered 2018-08-04: 11 [IU] via SUBCUTANEOUS
  Administered 2018-08-04 (×2): 8 [IU] via SUBCUTANEOUS
  Administered 2018-08-05: 15 [IU] via SUBCUTANEOUS
  Administered 2018-08-05: 8 [IU] via SUBCUTANEOUS

## 2018-08-03 NOTE — Progress Notes (Signed)
Inpatient Diabetes Program Recommendations  AACE/ADA: New Consensus Statement on Inpatient Glycemic Control (2015)  Target Ranges:  Prepandial:   less than 140 mg/dL      Peak postprandial:   less than 180 mg/dL (1-2 hours)      Critically ill patients:  140 - 180 mg/dL   Lab Results  Component Value Date   GLUCAP 155 (H) 08/03/2018   HGBA1C 13.7 (H) 08/02/2018    Review of Glycemic Control  Diabetes history: New onset DM 07-29-18 Outpatient Diabetes medications: Metformin 500 mg bid (chart states not taking) Current orders for Inpatient glycemic control: IV insulin transitioning to SQ insulin Lantus 15 units + Novolog 0-5 hs  Inpatient Diabetes Program Recommendations:   Please consider C-peptide to determine possibility of type 1 instead of type 2 -Add Novolog sensitive tid along with the order of hs 0-5 units Novolog correction -When eating, may need additional meal coverage tid if eats 50%  Thank you, Darel Hong E. Debera Sterba, RN, MSN, CDE  Diabetes Coordinator Inpatient Glycemic Control Team Team Pager [phone] (8am-5pm) 08/03/2018 9:59 AM

## 2018-08-03 NOTE — Progress Notes (Addendum)
ANTICOAGULATION CONSULT NOTE  Pharmacy Consult for IV heparin >> Eliquis Indication: atrial fibrillation  Allergies  Allergen Reactions  . Metformin And Related Nausea And Vomiting and Other (See Comments)    Altered mental state    Patient Measurements: Height: 6\' 2"  (188 cm) Weight: 263 lb 0.1 oz (119.3 kg) IBW/kg (Calculated) : 82.2 Heparin Dosing Weight: 107.6  Vital Signs: Temp: 98.5 F (36.9 C) (05/16 0400) Temp Source: Oral (05/16 0400) BP: 95/58 (05/16 0604) Pulse Rate: 83 (05/16 0604)  Labs: Recent Labs    08/02/18 1054  08/02/18 1824 08/03/18 0039 08/03/18 0536  HGB 14.3  --   --   --  12.2*  HCT 45.2  --   --   --  38.7*  PLT 207  --   --   --  148*  APTT  --   --   --   --  107*  LABPROT  --   --   --   --  14.3  INR  --   --   --   --  1.1  HEPARINUNFRC  --   --   --  0.53 0.62  CREATININE 2.03*   < > 1.45* 1.20 1.11  TROPONINI  --   --  <0.03 <0.03 <0.03   < > = values in this interval not displayed.    Estimated Creatinine Clearance: 109.2 mL/min (by C-G formula based on SCr of 1.11 mg/dL).   Medications:  Scheduled:  . Chlorhexidine Gluconate Cloth  6 each Topical Daily  . docusate sodium  100 mg Oral BID  . insulin aspart  0-5 Units Subcutaneous QHS  . insulin glargine  15 Units Subcutaneous QHS  . metoprolol tartrate  25 mg Oral BID  . sodium chloride flush  3 mL Intravenous Q12H  . tamsulosin  0.4 mg Oral PC supper   Infusions:  . sodium chloride 200 mL/hr at 08/02/18 1653  . dextrose 5 % and 0.45% NaCl 75 mL/hr at 08/03/18 0608  . dextrose 5 % and 0.45% NaCl Stopped (08/02/18 1654)  . heparin 1,500 Units/hr (08/02/18 1857)  . insulin 3.4 Units/hr (08/03/18 7989)    Assessment: 52 yo with new onset DM also with Afib to start IV heparin per Rx dosing per Cards. Note that patient received a 5000 unit SQ heparin dose at 1700 this PM. Baseline labs drawn and WNL except for elevated SCr at 1.81   Baseline INR wnl, aPTT drawn after  heparin start and elevated as expected  Prior anticoagulation: none  Significant events:  Today, 08/03/2018:  CBC: Hgb and Plt both slightly low  Confirmatory heparin level therapeutic on 1500 units/hr  No bleeding or infusion issues per nursing  Transition to Eliquis today  Goal of Therapy: Prevent thromboembolic stroke  Plan:  Continue heparin IV infusion at 1500 units/hr for now  Start Eliquis 5 mg PO bid at 10a; stop heparin with first dose of Eliquis  Monitor for signs of bleeding or thrombosis  Pharmacy to provide Eliquis education prior to discharge   [name], PharmD, BCPS [phone] 08/03/2018, 7:36 AM

## 2018-08-03 NOTE — Discharge Instructions (Addendum)
Your appt 08/19/18 will be in the office.  So come to [address] office on [address] floor   YOUR CARDIOLOGY TEAM may arrange an E-VISIT FOR YOUR APPOINTMENT in the future. - PLEASE REVIEW IMPORTANT INFORMATION BELOW SEVERAL DAYS PRIOR TO YOUR APPOINTMENT  Due to the recent COVID-19 pandemic, we are transitioning in-person office visits to tele-medicine visits in an effort to decrease unnecessary exposure to our patients, their families, and staff. These visits are billed to your insurance just like a normal visit is. We also encourage you to sign up for MyChart if you have not already done so. You will need a smartphone if possible. For patients that do not have this, we can still complete the visit using a regular telephone but do prefer a smartphone to enable video when possible. You may have a family member that lives with you that can help. If possible, we also ask that you have a blood pressure cuff and scale at home to measure your blood pressure, heart rate and weight prior to your scheduled appointment. Patients with clinical needs that need an in-person evaluation and testing will still be able to come to the office if absolutely necessary. If you have any questions, feel free to call our office.   YOUR PROVIDER WILL BE USING THE FOLLOWING PLATFORM TO COMPLETE YOUR VISIT:  Pt has Iphone   IF USING MYCHART - How to Download the MyChart App to Your SmartPhone   - If Apple, go to [employer] and type in MyChart in the search bar and download the app. If Android, ask patient to go to [employer] and type in [place] in the search bar and download the app. The app is free but as with any other app downloads, your phone may require you to verify saved payment information or Apple/Android password.  - You will need to then log into the app with your MyChart username and password, and select [facility] as your healthcare provider to link the account.  - When it is time for your visit, go to the  MyChart app, find appointments, and click Begin Video Visit. Be sure to Select Allow for your device to access the Microphone and Camera for your visit. You will then be connected, and your provider will be with you shortly.  **If you have any issues connecting or need assistance, please contact MyChart service desk [phone])**  **If using a computer, in order to ensure the best quality for your visit, you will need to use either of the following Internet Browsers: Agricultural consultant or Microsoft Edge**   IF USING DOXIMITY or [email or web address] - The staff will give you instructions on receiving your link to join the meeting the day of your visit.      2-3 DAYS BEFORE YOUR APPOINTMENT  You will receive a telephone call from one of our [facility] team members - your caller ID may say "Unknown caller." If this is a video visit, we will walk you through how to get the video launched on your phone. We will remind you check your blood pressure, heart rate and weight prior to your scheduled appointment. If you have an Apple Watch or Kardia, please upload any pertinent ECG strips the day before or morning of your appointment to MyChart. Our staff will also make sure you have reviewed the consent and agree to move forward with your scheduled tele-health visit.     THE DAY OF YOUR APPOINTMENT  Approximately 15 minutes prior  to your scheduled appointment, you will receive a telephone call from one of [facility] team - your caller ID may say "Unknown caller."  Our staff will confirm medications, vital signs for the day and any symptoms you may be experiencing. Please have this information available prior to the time of visit start. It may also be helpful for you to have a pad of paper and pen handy for any instructions given during your visit. They will also walk you through joining the smartphone meeting if this is a video visit.    CONSENT FOR TELE-HEALTH VISIT - PLEASE REVIEW  I hereby voluntarily  request, consent and authorize [facility] and its employed or contracted physicians, physician assistants, nurse practitioners or other licensed health care professionals (the Practitioner), to provide me with telemedicine health care services (the Services") as deemed necessary by the treating Practitioner. I acknowledge and consent to receive the Services by the Practitioner via telemedicine. I understand that the telemedicine visit will involve communicating with the Practitioner through live audiovisual communication technology and the disclosure of certain medical information by electronic transmission. I acknowledge that I have been given the opportunity to request an in-person assessment or other available alternative prior to the telemedicine visit and am voluntarily participating in the telemedicine visit.  I understand that I have the right to withhold or withdraw my consent to the use of telemedicine in the course of my care at any time, without affecting my right to future care or treatment, and that the Practitioner or I may terminate the telemedicine visit at any time. I understand that I have the right to inspect all information obtained and/or recorded in the course of the telemedicine visit and may receive copies of available information for a reasonable fee.  I understand that some of the potential risks of receiving the Services via telemedicine include:   Delay or interruption in medical evaluation due to technological equipment failure or disruption;  Information transmitted may not be sufficient (e.g. poor resolution of images) to allow for appropriate medical decision making by the Practitioner; and/or   In rare instances, security protocols could fail, causing a breach of personal health information.  Furthermore, I acknowledge that it is my responsibility to provide information about my medical history, conditions and care that is complete and accurate to the best of my  ability. I acknowledge that Practitioner's advice, recommendations, and/or decision may be based on factors not within their control, such as incomplete or inaccurate data provided by me or distortions of diagnostic images or specimens that may result from electronic transmissions. I understand that the practice of medicine is not an exact science and that Practitioner makes no warranties or guarantees regarding treatment outcomes. I acknowledge that I will receive a copy of this consent concurrently upon execution via email to the email address I last provided but may also request a printed copy by calling the office of [facility].    I understand that my insurance will be billed for this visit.   I have read or had this consent read to me.  I understand the contents of this consent, which adequately explains the benefits and risks of the Services being provided via telemedicine.   I have been provided ample opportunity to ask questions regarding this consent and the Services and have had my questions answered to my satisfaction.  I give my informed consent for the services to be provided through the use of telemedicine in my medical care  By participating  in this telemedicine visit I agree to the above.   Information on my medicine - ELIQUIS (apixaban)  This medication education was reviewed with me or my healthcare representative as part of my discharge preparation.  The pharmacist that spoke with me during my hospital stay was:  WOFFORD, DREW A, RPH  Why was Eliquis prescribed for you? Eliquis was prescribed to treat blood clots that may have been found in the veins of your legs (deep vein thrombosis) or in your lungs (pulmonary embolism) and to reduce the risk of them occurring again.  What do You need to know about Eliquis ? The starting dose is 10 mg (two 5 mg tablets) taken TWICE daily for the FIRST SEVEN (7) DAYS, then on (enter date)  08/10/18  the dose is reduced to ONE 5 mg  tablet taken TWICE daily.  Eliquis may be taken with or without food.   Try to take the dose about the same time in the morning and in the evening. If you have difficulty swallowing the tablet whole please discuss with your pharmacist how to take the medication safely.  Take Eliquis exactly as prescribed and DO NOT stop taking Eliquis without talking to the doctor who prescribed the medication.  Stopping may increase your risk of developing a new blood clot.  Refill your prescription before you run out.  After discharge, you should have regular check-up appointments with your healthcare provider that is prescribing your Eliquis.    What do you do if you miss a dose? If a dose of ELIQUIS is not taken at the scheduled time, take it as soon as possible on the same day and twice-daily administration should be resumed. The dose should not be doubled to make up for a missed dose.  Important Safety Information A possible side effect of Eliquis is bleeding. You should call your healthcare provider right away if you experience any of the following: ? Bleeding from an injury or your nose that does not stop. ? Unusual colored urine (red or dark brown) or unusual colored stools (red or black). ? Unusual bruising for unknown reasons. ? A serious fall or if you hit your head (even if there is no bleeding).  Some medicines may interact with Eliquis and might increase your risk of bleeding or clotting while on Eliquis. To help avoid this, consult your healthcare provider or pharmacist prior to using any new prescription or non-prescription medications, including herbals, vitamins, non-steroidal anti-inflammatory drugs (NSAIDs) and supplements.  This website has more information on Eliquis (apixaban): http://www.eliquis.com/eliquis/home    Diabetes Label Reading Tips Check the Nutrition Facts on food labels for nutrient information for the food. The Nutrition Facts information is based on a  standard serving size.  However, that serving size may not be the same as the serving size used in carbohydrate counting. Always start by checking the serving size on the label. Is this the serving size you will be eating?  How many servings are in the package? Next, look at the total carbohydrate. It is measured in grams (g).  To find the number of carbohydrate servings in 1 standard serving of a food, divide the total grams of carbohydrate by 15.  One (1) carbohydrate serving is the amount of food with 15 g carbohydrate. You dont need to count grams of sugars. They are included in the total carbohydrate. The label shows how many calories are in the standard serving. It also lists the amount of fat, cholesterol, sodium, protein, and some  vitamins and minerals.  Look below the line listing total fat to find out how much of that fat is saturated fat or trans fat.  Choose foods that are low in these kinds of fats because they are not healthy for your heart. In the foods that are healthiest for your heart, grams of saturated fat and trans fat are less than one-third of the total fat grams. If foods are very low in calories (less than 20 calories per serving) or carbohydrates (5 g carbohydrate or less per serving), you may not need to count them when you count carbohydrates.    Carbohydrate Counting for People with Diabetes Why Is Carbohydrate Counting Important? Counting carbohydrate servings may help you control your blood glucose level so that you feel better. The balance between the carbohydrates you eat and insulin determines what your blood glucose level will be after eating. Carbohydrate counting can also help you plan your meals.  Which Foods Have Carbohydrates? Foods with carbohydrates include: Breads, crackers, and cereals Pasta, rice, and grains Starchy vegetables, such as potatoes, corn, and peas Beans and legumes Milk, soy milk, and yogurt Fruits and fruit juices Sweets,  such as cakes, cookies, ice cream, jam, and jelly  Carbohydrate Servings In diabetes meal planning, 1 serving of a food with carbohydrate has about 15 grams of carbohydrate: Check serving sizes with measuring cups and spoons or a food scale. Read the Nutrition Facts on food labels to find out how many grams of carbohydrate are in foods you eat. The food lists in this handout show portions that have about 15 grams of carbohydrate.  Meal Planning Tips An Eating Plan tells you how many carbohydrate servings to eat at your meals and snacks. For many adults, eating 3-5 servings of carbohydrate foods at each meal and 1 or 2 carbohydrate servings for each snack works well. In a healthy daily Eating Plan, most carbohydrates come from: At least 6 servings of fruits and nonstarchy vegetables At least 6 servings of grains, beans, and starchy vegetables, with at least 3 servings from whole grains At least 2 servings of milk or milk products.  Check your blood glucose level regularly. It can tell you if you need to adjust when you eat carbohydrates. Eating foods that have fiber, such as whole grains, and having very few salty foods is good for your health. Eat 4-6 ounces of meat or other protein foods (such as soybean burgers) each day.  Choose low-fat sources of protein, such as lean beef, lean pork, chicken, fish, low-fat cheese, or vegetarian foods such as soy. Eat some healthy fats, such as olive oil, canola oil, and nuts. Eat very little saturated fats. These unhealthy fats are found in butter, cream, and high-fat meats, such as bacon and sausage. Eat very little or no trans fats. These unhealthy fats are found in all foods that list partially hydrogenated oil as an Ingredient.  Label Reading Tips The Nutrition Facts panel on a label lists the grams of total carbohydrate in 1 standard serving. The labels standard serving may be larger or smaller than 1 carbohydrate serving. To figure out how  many carbohydrate servings are in the food: First, look at the labels standard serving size. Check the grams of total carbohydrate. This is the amount of carbohydrate in 1 standard serving. Divide the grams of total carbohydrate by 15. This number equals the number of carbohydrate servings in 1 standard serving. Remember: 1 carbohydrate serving is 15 grams of carbohydrate. Note: You may ignore  the grams of sugars on the Nutrition Facts panel because they are included in the grams of total carbohydrate.  Foods Recommended 1 serving = about 15 grams of carbohydrate  Starches 1 slice bread (1 ounce) 1 tortilla (6-inch size)  large bagel (1 ounce) 2 taco shells (5-inch size)  hamburger or hot dog bun ( ounce)  cup ready-to-eat unsweetened cereal  cup cooked cereal 1 cup broth-based soup 4 to 6 small crackers 1/3 cup pasta or rice (cooked)  cup beans, peas, corn, sweet potatoes, winter squash, or mashed or boiled potatoes (cooked)  large baked potato (3 ounces)  ounce pretzels, potato chips, or tortilla chips 3 cups popcorn (popped)  Fruit 1 small fresh fruit ( to 1 cup)  cup canned or frozen fruit 2 tablespoons dried fruit (blueberries, cherries, cranberries, mixed fruit, raisins) 17 small grapes (3 ounces) 1 cup melon or berries  cup unsweetened fruit juice  Milk 1 cup fat-free or reduced-fat milk 1 cup soy milk 2/3 cup (6 ounces) nonfat yogurt sweetened with sugar-free sweetener  Sweets and Desserts 2-inch square cake (unfrosted) 2 small cookies (2/3 ounce)  cup ice cream or frozen yogurt  cup sherbet or sorbet 1 tablespoon syrup, jam, jelly, table sugar, or honey 2 tablespoons light syrup  Other Foods Count 1 cup raw vegetables or  cup cooked nonstarchy vegetables as zero (0) carbohydrate servings or free foods. If you eat 3 or more servings at one meal, count them as 1 carbohydrate serving. Foods that have less than 20 calories in each serving also  may be counted as zero carbohydrate servings or free foods. Count 1 cup of casserole or other mixed foods as 2 carbohydrate servings.  Carbohydrate Counting for People with Diabetes Sample 1-Day Menu Breakfast 1 extra-small banana (1 carbohydrate serving) 3/4 cup corn flakes (1 carbohydrate serving) 1 cup low-fat or fat-free milk (1 carbohydrate serving) 1 slice whole wheat bread (1 carbohydrate serving) 1 teaspoon margarine Lunch 2 ounces [place] slices 2 slices whole wheat bread (2 carbohydrate servings) 2 lettuce leaves 4 celery sticks 4 carrot sticks 1 medium apple (1 carbohydrate serving) 1 cup low-fat or fat-free milk (1 carbohydrate serving) Afternoon Snack  2 tablespoons raisins (1 carbohydrate serving) 3/4 ounce unsalted mini pretzels (1 carbohydrate serving) Evening Meal 3 ounces lean roast beef 1/2 large baked potato (2 carbohydrate servings) 1 tablespoon reduced-fat sour cream 1/2 cup green beans 1 cup vegetable salad 1 tablespoon light salad dressing 1 whole wheat dinner roll (1 carbohydrate serving) 1 teaspoon margarine 1 cup melon balls (1 carbohydrate serving) Evening Snack  6 ounces low-fat sugar-free fruit yogurt (1 carbohydrate serving) 2 tablespoons unsalted nuts  Carbohydrate Counting for People with Diabetes Vegan Sample 1-Day Menu Breakfast 1 cup cooked oatmeal (2 carbohydrate servings)  cup blueberries (1 carbohydrate serving) 2 tablespoons flaxseeds 1 cup soymilk fortified with calcium and vitamin D 1 cup coffee Lunch 2 slices whole wheat bread (2 carbohydrate servings)  cup baked tofu  cup lettuce 2 slices tomato 2 slices avocado  cup baby carrots 1 orange (1 carbohydrate serving) 1 cup soymilk fortified with calcium and vitamin D Evening Meal Burrito made with: 1 6-inch corn tortilla (1 carbohydrate serving) 1 cup refried vegetarian beans (1 carbohydrate serving)  cup chopped tomatoes  cup lettuce  cup salsa 1/3 cup brown  rice (1 carbohydrate serving) 1 tablespoon olive oil for rice  cup zucchini Evening Snack 6 small whole grain crackers (1 carbohydrate serving) 2 apricots ( carbohydrate serving)  cup unsalted peanuts (  carbohydrate serving)  Carbohydrate Counting for People with Diabetes Vegetarian (Lacto-Ovo) Sample 1-Day Menu Breakfast 1 cup cooked oatmeal (2 carbohydrate servings)  cup blueberries (1 carbohydrate serving) 2 tablespoons flaxseeds 1 egg 1 cup 1% milk (1 carbohydrate serving) 1 cup coffee Lunch 2 slices whole wheat bread (2 carbohydrate servings) 2 ounces low-fat cheese  cup lettuce 2 slices tomato 2 slices avocado  cup baby carrots 1 orange (1 carbohydrate serving) 1 cup unsweetened tea Evening Meal Burrito made with: 1 6-inch corn tortilla (1 carbohydrate serving)  cup refried vegetarian beans (1 carbohydrate serving)  cup tomatoes  cup lettuce  cup salsa 1/3 cup brown rice (1 carbohydrate serving) 1 tablespoon olive oil for rice  cup zucchini 1 cup 1% milk (1 carbohydrate serving) Evening Snack 6 small whole grain crackers (1 carbohydrate serving) 2 apricots ( carbohydrate serving)  cup unsalted peanuts ( carbohydrate serving)   Fingerstick glucose (sugar) goals for home: Before meals: 80-130 mg/dl 2-Hours after meals: less than 180 mg/dl Hemoglobin Z6X goal: 7% or less  Insulin Pen Instructions:  1. Remove Insulin pen cap and clean pen 1st with alcohol rub and then clean skin 2nd with alcohol rub 2. Twist insulin pen needle onto pen (right tighty) 3. Remove outer cap and inner cap from needle 4. Dial pen to 2 units and perform prime- press pen to zero and make sure liquid (insulin) comes out of the needle 5. Dial pen to your dose and perform injection into your abdomen 6. Hold needle in skin for 10 seconds after injection 7. Remove needle from insulin pen and discard 8. Place cap back on insulin pen and store safely (at room temperature) 9.  Store unused pens in refrigerator and can keep opened insulin pen at room temperature (discard used pen after 30 days)  Symptoms of Hypoglycemia: Silly, Sweaty, Shaky Check sugar if you have your meter.  If near or less than 70 mg/dl, treat with 1/2 cup juice or soda or take glucose tablets Check sugar 15 minutes after treatment.  If sugar still near or less than 70 mg/al and symptomatic, treat again and may need a snack with some protein (peanut butter with crackers, etc)  [facility] [phone]):  [facility] recommendations: Dr. Carlus Pavlov Dr. Reather Littler Dr. [name] Dukes

## 2018-08-03 NOTE — Progress Notes (Signed)
PROGRESS NOTE    [name]  FYB:[id number] DOB: [birth year] DOA: 08/02/2018 PCP: Patient, No Pcp Per   Brief Narrative:  NEILS Walls is a 53 y.o. male with medical history significant of newly diagnosed diabetes mellitus and hypertension Presented at [place] [facility] 08/02/2018 with nausea , vomiting, abdominal pain for 2 days.he was found to be in hyperglycemic state, hypotensive, acute renal insufficiency, elevated LFTs, and diabetic ketoacidotic state with blood sugars of 482, 508, anion gap of 21, creatinine of 2.03  He reported that he has had a recent back injury with a bulging disc, therefore receiving steroid shots to his back, and finished oral steroid taper.  He was started on DKA protocol and he was subsequently transferred under hospitalist service at [facility] however upon arrival to ICU and upon arrival to the ICU, he was found to be in atrial flutter and atrial fibrillation.  Patient does not carry any history of atrial fibrillation.  Slightly hypotensive.  He was given some Cardizem.  Cardiology was consulted.  He was started on IV anticoagulation and oral beta-blockers by cardiology.  Consultants:   Cardiology  Procedures:   None  Antimicrobials:   None   Subjective: Patient seen and examined.  He has no complaints.  Objective: Vitals:   08/03/18 0800 08/03/18 1000 08/03/18 1025 08/03/18 1211  BP: (!) 99/53 (!) 84/57 (!) 81/53   Pulse: 85 74 93 (!) 113  Resp: (!) 8 19 13    Temp: 97.6 F (36.4 C)     TempSrc: Oral     SpO2: 99% 98% 99%   Weight:      Height:        Intake/Output Summary (Last 24 hours) at 08/03/2018 1223 Last data filed at 08/03/2018 5277 Gross per 24 hour  Intake 3802.94 ml  Output 350 ml  Net 3452.94 ml   Filed Weights   08/02/18 1041 08/02/18 1702 08/03/18 0500  Weight: 112 kg 118.9 kg 119.3 kg    Examination:  General exam: Appears calm and comfortable  Respiratory system: Clear to auscultation.  Respiratory effort normal. Cardiovascular system: S1 & S2 heard, irregularly regular rate and rhythm. No JVD, murmurs, rubs, gallops or clicks. No pedal edema. Gastrointestinal system: Abdomen is nondistended, soft and nontender. No organomegaly or masses felt. Normal bowel sounds heard. Central nervous system: Alert and oriented. No focal neurological deficits. Extremities: Symmetric 5 x 5 power. Skin: No rashes, lesions or ulcers Psychiatry: Judgement and insight appear normal. Mood & affect appropriate.    Data Reviewed: I have personally reviewed following labs and imaging studies  CBC: Recent Labs  Lab 07/29/18 0732 08/02/18 1054 08/03/18 0536  WBC 7.7 8.7 6.9  NEUTROABS 3.9  --   --   HGB 13.9 14.3 12.2*  HCT 42.6 45.2 38.7*  MCV 82.2 85.0 85.1  PLT 169 207 824*   Basic Metabolic Panel: Recent Labs  Lab 08/02/18 1508 08/02/18 1824 08/03/18 0039 08/03/18 0536 08/03/18 0917  NA 135 138 137 137 137  K 4.2 3.7 3.6 3.4* 3.6  CL 109 111 112* 112* 111  CO2 14* 17* 15* 16* 17*  GLUCOSE 335* 184* 172* 199* 155*  BUN 26* 24* 21* 20 20  CREATININE 1.81* 1.45* 1.20 1.11 1.14  CALCIUM 8.0* 8.5* 8.3* 8.3* 8.4*  MG  --  2.3  --   --   --   PHOS  --  2.0*  --   --   --    GFR:  Estimated Creatinine Clearance: 106.4 mL/min (by C-G formula based on SCr of 1.14 mg/dL). Liver Function Tests: Recent Labs  Lab 08/02/18 1054  AST 57*  ALT 65*  ALKPHOS 90  BILITOT 1.5*  PROT 8.2*  ALBUMIN 4.5   Recent Labs  Lab 08/02/18 1054  LIPASE 50   No results for input(s): AMMONIA in the last 168 hours. Coagulation Profile: Recent Labs  Lab 08/03/18 0536  INR 1.1   Cardiac Enzymes: Recent Labs  Lab 08/02/18 1824 08/03/18 0039 08/03/18 0536  TROPONINI <0.03 <0.03 <0.03   BNP (last 3 results) No results for input(s): PROBNP in the last 8760 hours. HbA1C: Recent Labs    08/02/18 1824  HGBA1C 13.7*   CBG: Recent Labs  Lab 08/03/18 0732 08/03/18 0827 08/03/18  0929 08/03/18 1032 08/03/18 1128  GLUCAP 171* 157* 155* 245* 185*   Lipid Profile: No results for input(s): CHOL, HDL, LDLCALC, TRIG, CHOLHDL, LDLDIRECT in the last 72 hours. Thyroid Function Tests: Recent Labs    08/02/18 1824  TSH 1.929   Anemia Panel: No results for input(s): VITAMINB12, FOLATE, FERRITIN, TIBC, IRON, RETICCTPCT in the last 72 hours. Sepsis Labs: No results for input(s): PROCALCITON, LATICACIDVEN in the last 168 hours.  Recent Results (from the past 240 hour(s))  SARS Coronavirus 2 (Hosp order,Performed in [facility] lab via [employer] ID)     Status: Abnormal   Collection Time: 08/02/18  1:14 PM  Result Value Ref Range Status   SARS Coronavirus 2 (A[employer] ID Now) (A) NEGATIVE Final    INVALID, UNABLE TO DETERMINE THE PRESENCE OF TARGET DNA DUE TO SPECIMEN INTEGRITY. RECOLLECTION REQUESTED.    Comment: (NOTE) Interpretive Result Comment(s): COVID 19 Positive SARS CoV 2 target nucleic acids are DETECTED. The SARS CoV 2 RNA is generally detectable in upper and lower respiratory specimens during the acute phase of infection.  Positive results are indicative of active infection with SARS CoV 2.  Clinical correlation with patient history and other diagnostic information is necessary to determine patient infection status.  Positive results do not rule out bacterial infection or coinfection with other viruses. The expected result is Negative. COVID 19 Negative SARS CoV 2 target nucleic acids are NOT DETECTED. The SARS CoV 2 RNA is generally detectable in upper and lower respiratory specimens during the acute phase of infection.  Negative results do not preclude SARS CoV 2 infection, do not rule out coinfections with other pathogens, and should not be used as the sole basis for treatment or other patient management decisions.  Negative results must be combined with clinical  observations, patient history, and epidemiological information. The expected result is  Negative. Invalid Presence or absence of SARS CoV 2 nucleic acids cannot be determined. Repeat testing was performed on the submitted specimen and repeated Invalid results were obtained.  If clinically indicated, additional testing on a new specimen with an alternate test methodology [phone]) is advised.  The SARS CoV 2 RNA is generally detectable in upper and lower respiratory specimens during the acute phase of infection. The expected result is Negative. Fact Sheet for Patients:  GolfingFamily.no Fact Sheet for Healthcare Providers: https://www.hernandez-brewer.com/ This test is not yet approved or cleared by the [place] FDA and has been authorized for detection and/or diagnosis of SARS CoV 2 by FDA under an Emergency Use Authorization (EUA).  This EUA will remain in effect (meaning this test can be used) for the duration of the COVID19 d eclaration under Section 564(b)(1) of the Act, 21 U.S.C. section  360bbb 3(b)(1), unless the authorization is terminated or revoked sooner. Performed at [facility], [address] [place], [place] [address]   SARS Coronavirus 2 (Hosp order,Performed in [facility] lab via Abbott ID)     Status: None   Collection Time: 08/02/18  1:41 PM  Result Value Ref Range Status   SARS Coronavirus 2 (Abbott ID Now) NEGATIVE NEGATIVE Final    Comment: (NOTE) Interpretive Result Comment(s): COVID 19 Positive SARS CoV 2 target nucleic acids are DETECTED. The SARS CoV 2 RNA is generally detectable in upper and lower respiratory specimens during the acute phase of infection.  Positive results are indicative of active infection with SARS CoV 2.  Clinical correlation with patient history and other diagnostic information is necessary to determine patient infection status.  Positive results do not rule out bacterial infection or coinfection with other viruses. The expected result is Negative. COVID 19 Negative  SARS CoV 2 target nucleic acids are NOT DETECTED. The SARS CoV 2 RNA is generally detectable in upper and lower respiratory specimens during the acute phase of infection.  Negative results do not preclude SARS CoV 2 infection, do not rule out coinfections with other pathogens, and should not be used as the sole basis for treatment or other patient management decisions.  Negative results must be combined with clinical  observations, patient history, and epidemiological information. The expected result is Negative. Invalid Presence or absence of SARS CoV 2 nucleic acids cannot be determined. Repeat testing was performed on the submitted specimen and repeated Invalid results were obtained.  If clinically indicated, additional testing on a new specimen with an alternate test methodology [phone]) is advised.  The SARS CoV 2 RNA is generally detectable in upper and lower respiratory specimens during the acute phase of infection. The expected result is Negative. Fact Sheet for Patients:  GolfingFamily.no Fact Sheet for Healthcare Providers: https://www.hernandez-brewer.com/ This test is not yet approved or cleared by the [place] FDA and has been authorized for detection and/or diagnosis of SARS CoV 2 by FDA under an Emergency Use Authorization (EUA).  This EUA will remain in effect (meaning this test can be used) for the duration of the COVID19 d eclaration under Section 564(b)(1) of the Act, 21 U.S.C. section [phone] 3(b)(1), unless the authorization is terminated or revoked sooner. Performed at [facility], [address] [place], [place] [address]   MRSA PCR Screening     Status: None   Collection Time: 08/02/18  5:20 PM  Result Value Ref Range Status   MRSA by PCR NEGATIVE NEGATIVE Final    Comment:        The GeneXpert MRSA Assay (FDA approved for NASAL specimens only), is one component of a comprehensive MRSA colonization  surveillance program. It is not intended to diagnose MRSA infection nor to guide or monitor treatment for MRSA infections. Performed at [facility], [place] [address], [place], [place] [address]       Radiology Studies: US Abdomen Limited  Result Date: 08/02/2018 CLINICAL DATA:  Upper abdominal pain. EXAM: ULTRASOUND ABDOMEN LIMITED RIGHT UPPER QUADRANT COMPARISON:  CT abdomen pelvis dated January 30, 2016. FINDINGS: Gallbladder: Multiple small gallstones. No wall thickening visualized. No sonographic Murphy sign noted by sonographer. Common bile duct: Diameter: 4 mm, normal. Liver: No focal lesion identified. Diffusely increased in parenchymal echogenicity. Portal vein is patent on color Doppler imaging with normal direction of blood flow towards the liver. IMPRESSION: 1. Cholelithiasis without sonographic  evidence of acute cholecystitis. 2. Hepatic steatosis. Electronically Signed   By: Titus Dubin M.D.   On: 08/02/2018 13:05    Scheduled Meds: . apixaban  5 mg Oral BID  . Chlorhexidine Gluconate Cloth  6 each Topical Daily  . docusate sodium  100 mg Oral BID  . insulin aspart  0-5 Units Subcutaneous QHS  . insulin glargine  15 Units Subcutaneous QHS  . insulin starter kit- pen needles  1 kit Other Once  . living well with diabetes book   Does not apply Once  . metoprolol tartrate  25 mg Oral BID  . potassium chloride  40 mEq Oral Q6H  . sodium chloride flush  3 mL Intravenous Q12H  . tamsulosin  0.4 mg Oral PC supper   Continuous Infusions: . 0.9 % NaCl with KCl 40 mEq / L 75 mL/hr (08/03/18 1118)  . dextrose 5 % and 0.45% NaCl Stopped (08/03/18 1120)  . insulin Stopped (08/03/18 1137)     LOS: 1 day   Assessment & Plan:   Principal Problem:   DKA (diabetic ketoacidoses) ([place]) Active Problems:   HTN (hypertension)   Hypotension   AKI (acute kidney injury) ([place])   Transaminitis   DKA (diabetic ketoacidoses) (HCC)/with recently diagnosed of  diabetes mellitus type 2: Patient's DKA has resolved as his anion gap has closed.  At this point in time, I will start him on Lantus 15 units stifles dose and stop his insulin drip 2 hours later.  Let him eat diabetic diet and start him on sliding scale insulin.  Switch fluids to normal saline with potassium chloride at 100 cc/h.  Nausea /vomiting: Secondary to DKA.  Resolved.  Hypotension /recently diagnosed of hypertension-Continue with IV fluid resuscitation, holding blood pressure medications.  Cardiology on board.  AKI (acute kidney injury) ([place]): Secondary to dehydration due to DKA.  Resolved.  Elevated LFTs: Repeat LFTs today and check acute viral hepatitis panel.  Chronic back pain, disc bulging questionable rupture/exacerbation needs to sciatic pain -Patient is being followed as an outpatient with steroid shots, status post tapered p.o. steroids treatment -Continue PRN analgesics-PT  Atrial flutter -new onset: Rate is controlled but is still in flutter.  He is on beta-blocker and anticoagulation.  Cardiology on board.  Management per them.  Echo with no visual defect and normal ejection fraction.  DVT prophylaxis: Eliquis Code Status: Full code Family Communication: Discussed with patient Disposition Plan: Remain in ICU for now.   Time spent: 40 min    Darliss Cheney, MD [facility] Pager [phone]  If 7PM-7AM, please contact night-coverage [email or web address] Password [facility] 08/03/2018, 12:23 PM

## 2018-08-03 NOTE — Progress Notes (Signed)
  Echocardiogram 2D Echocardiogram has been performed.  [name] G Everet Flagg 08/03/2018, 12:07 PM

## 2018-08-03 NOTE — Progress Notes (Signed)
Assumed patient care at 1530 and I agree with the previous RN's assessment of the pt. Vital signs are stable and the pt is in no acute distress. Will continue to monitor pt condition.
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
## 2018-08-03 NOTE — Evaluation (Signed)
Physical Therapy Evaluation Patient Details Name: Jonathan FiddlerHorace L Kaufmann MRN: [id number] DOB: July 05, 1965 Today's Date: 08/03/2018   History of Present Illness  53 y.o. male who was treated with steroids for low back injury.  He was  newly diagnosed steroid induced DM admitted with  nausea , vomiting, abdominal pain, in hyperglycemic state, hypotensive, acute renal insufficiency, elevated LFTs, and diabetic ketoacidotic state   Clinical Impression  Pt ambulated 200' independently, no loss of balance, HR 113 walking, no dyspnea. He is safe to ambulate in the halls independently and was encouraged to do so at least BID. No further PT indicated, will sign off.     Follow Up Recommendations No PT follow up    Equipment Recommendations  None recommended by PT    Recommendations for Other Services       Precautions / Restrictions Precautions Precautions: None      Mobility  Bed Mobility Overal bed mobility: Independent                Transfers Overall transfer level: Independent                  Ambulation/Gait Ambulation/Gait assistance: Independent Gait Distance (Feet): 200 Feet Assistive device: None Gait Pattern/deviations: WFL(Within Functional Limits) Gait velocity: WNL   General Gait Details: steady, no loss of balance, HR 113 walking, no dyspnea  Stairs            Wheelchair Mobility    Modified Rankin (Stroke Patients Only)       Balance Overall balance assessment: Independent                                           Pertinent Vitals/Pain Pain Assessment: No/denies pain    Home Living Family/patient expects to be discharged to:: Private residence Living Arrangements: Spouse/significant other;Children Available Help at Discharge: Family                  Prior Function Level of Independence: Independent               Hand Dominance        Extremity/Trunk Assessment   Upper Extremity Assessment Upper  Extremity Assessment: Overall WFL for tasks assessed    Lower Extremity Assessment Lower Extremity Assessment: Overall WFL for tasks assessed    Cervical / Trunk Assessment Cervical / Trunk Assessment: Normal  Communication   Communication: No difficulties  Cognition Arousal/Alertness: Awake/alert Behavior During Therapy: WFL for tasks assessed/performed Overall Cognitive Status: Within Functional Limits for tasks assessed                                        General Comments      Exercises     Assessment/Plan    PT Assessment Patent does not need any further PT services  PT Problem List         PT Treatment Interventions      PT Goals (Current goals can be found in the Care Plan section)  Acute Rehab PT Goals Patient Stated Goal: to be able to work out again PT Goal Formulation: All assessment and education complete, DC therapy    Frequency     Barriers to discharge        Co-evaluation  AM-PAC PT "6 Clicks" Mobility  Outcome Measure Help needed turning from your back to your side while in a flat bed without using bedrails?: None Help needed moving from lying on your back to sitting on the side of a flat bed without using bedrails?: None Help needed moving to and from a bed to a chair (including a wheelchair)?: None Help needed standing up from a chair using your arms (e.g., wheelchair or bedside chair)?: None Help needed to walk in hospital room?: None Help needed climbing 3-5 steps with a railing? : None 6 Click Score: 24    End of Session Equipment Utilized During Treatment: Gait belt Activity Tolerance: Patient tolerated treatment well Patient left: in chair;with call bell/phone within reach Nurse Communication: Mobility status(pt is safe to ambulate in halls independently)      Time: 5956-3875 PT Time Calculation (min) (ACUTE ONLY): 21 min   Charges:   PT Evaluation $PT Eval Low Complexity: 1 Low           Tamala Ser PT 08/03/2018  Acute Rehabilitation Services Pager [phone] Office [phone]

## 2018-08-03 NOTE — Progress Notes (Signed)
Progress Note  Patient Name: [name] Date of Encounter: 08/03/2018  Primary Cardiologist: Eldridge DaceVaranasi  Subjective   Still urinating a lot. Vision blurry  Inpatient Medications    Scheduled Meds: . Chlorhexidine Gluconate Cloth  6 each Topical Daily  . docusate sodium  100 mg Oral BID  . insulin aspart  0-5 Units Subcutaneous QHS  . insulin glargine  15 Units Subcutaneous QHS  . metoprolol tartrate  25 mg Oral BID  . potassium chloride  40 mEq Oral Q6H  . sodium chloride flush  3 mL Intravenous Q12H  . tamsulosin  0.4 mg Oral PC supper   Continuous Infusions: . sodium chloride 200 mL/hr at 08/02/18 1653  . dextrose 5 % and 0.45% NaCl 75 mL/hr at 08/03/18 0608  . dextrose 5 % and 0.45% NaCl Stopped (08/02/18 1654)  . heparin 1,500 Units/hr (08/02/18 1857)  . insulin 3.4 Units/hr (08/03/18 0635)   PRN Meds: acetaminophen **OR** acetaminophen, alum & mag hydroxide-simeth, HYDROcodone-acetaminophen, ketorolac, levalbuterol, magnesium citrate, ondansetron **OR** ondansetron (ZOFRAN) IV, oxyCODONE, senna-docusate, sorbitol   Vital Signs    Vitals:   08/03/18 0238 08/03/18 0400 08/03/18 0500 08/03/18 0604  BP: (!) 90/54 (!) 85/52  (!) 95/58  Pulse: 99 71  83  Resp: 15 18  18   Temp:  98.5 F (36.9 C)    TempSrc:  Oral    SpO2: 97% 99%  100%  Weight:   119.3 kg   Height:        Intake/Output Summary (Last 24 hours) at 08/03/2018 0752 Last data filed at 08/03/2018 16100635 Gross per 24 hour  Intake 3802.94 ml  Output 350 ml  Net 3452.94 ml   Last 3 Weights 08/03/2018 08/02/2018 08/02/2018  Weight (lbs) 263 lb 0.1 oz 262 lb 2 oz 246 lb 14.6 oz  Weight (kg) 119.3 kg 118.9 kg 112 kg      Telemetry    Flutter rate 75 - Personally Reviewed  ECG    Flutter no acute ischemic changes  - Personally Reviewed  Physical Exam  Obese black male  GEN: No acute distress.   Neck: No JVD Cardiac: RRR, no murmurs, rubs, or gallops.  Respiratory: Clear to auscultation  bilaterally. GI: Soft, nontender, non-distended  MS: No edema; No deformity. Neuro:  Nonfocal  Psych: Normal affect   Labs    Chemistry Recent Labs  Lab 08/02/18 1054  08/02/18 1824 08/03/18 0039 08/03/18 0536  NA 131*   < > 138 137 137  K 4.2   < > 3.7 3.6 3.4*  CL 100   < > 111 112* 112*  CO2 11*   < > 17* 15* 16*  GLUCOSE 508*   < > 184* 172* 199*  BUN 25*   < > 24* 21* 20  CREATININE 2.03*   < > 1.45* 1.20 1.11  CALCIUM 9.3   < > 8.5* 8.3* 8.3*  PROT 8.2*  --   --   --   --   ALBUMIN 4.5  --   --   --   --   AST 57*  --   --   --   --   ALT 65*  --   --   --   --   ALKPHOS 90  --   --   --   --   BILITOT 1.5*  --   --   --   --   GFRNONAA 37*   < > 56* >60 >60  GFRAA 43*   < > >  60 >60 >60  ANIONGAP 20*   < > 10 10 9    < > = values in this interval not displayed.     Hematology Recent Labs  Lab 07/29/18 0732 08/02/18 1054 08/03/18 0536  WBC 7.7 8.7 6.9  RBC 5.18 5.32 4.55  HGB 13.9 14.3 12.2*  HCT 42.6 45.2 38.7*  MCV 82.2 85.0 85.1  MCH 26.8 26.9 26.8  MCHC 32.6 31.6 31.5  RDW 12.6 13.3 13.4  PLT 169 207 148*    Cardiac Enzymes Recent Labs  Lab 08/02/18 1824 08/03/18 0039 08/03/18 0536  TROPONINI <0.03 <0.03 <0.03   No results for input(s): TROPIPOC in the last 168 hours.   BNPNo results for input(s): BNP, PROBNP in the last 168 hours.   DDimer No results for input(s): DDIMER in the last 168 hours.   Radiology    US Abdomen Limited  Result Date: 08/02/2018 CLINICAL DATA:  Upper abdominal pain. EXAM: ULTRASOUND ABDOMEN LIMITED RIGHT UPPER QUADRANT COMPARISON:  CT abdomen pelvis dated January 30, 2016. FINDINGS: Gallbladder: Multiple small gallstones. No wall thickening visualized. No sonographic Murphy sign noted by sonographer. Common bile duct: Diameter: 4 mm, normal. Liver: No focal lesion identified. Diffusely increased in parenchymal echogenicity. Portal vein is patent on color Doppler imaging with normal direction of blood flow towards  the liver. IMPRESSION: 1. Cholelithiasis without sonographic evidence of acute cholecystitis. 2. Hepatic steatosis. Electronically Signed   By: Obie Dredge M.D.   On: 08/02/2018 13:05    Cardiac Studies   Echo pedning   Patient Profile     53 y.o. male admitted with DKA and noted to be in atrial flutter and HTN CHADVASC 2  Assessment & Plan    Flutter : rate is well controlled and asymptomatic BP soft continue lopressor 25 bid Start eliquis. No need for TEE/DCC as rate is easily controlled unless echo shows depressed LV function Rate control and anticoagulate   DKA:  Intolerant to glucophage needs further education and plan for outpatient f/u Cr back to normal 1.1   HTN:  Continue to hold lisinopril with soft BP       For questions or updates, please contact [facility] Please consult [email or web address] for contact info under        Signed, Charlton Haws, MD  08/03/2018, 7:52 AM

## 2018-08-04 LAB — CBC
HCT: 38.7 % — ABNORMAL LOW (ref 39.0–52.0)
Hemoglobin: 11.7 g/dL — ABNORMAL LOW (ref 13.0–17.0)
MCH: 26 pg (ref 26.0–34.0)
MCHC: 30.2 g/dL (ref 30.0–36.0)
MCV: 86 fL (ref 80.0–100.0)
Platelets: 142 10*3/uL — ABNORMAL LOW (ref 150–400)
RBC: 4.5 MIL/uL (ref 4.22–5.81)
RDW: 13.4 % (ref 11.5–15.5)
WBC: 5.4 10*3/uL (ref 4.0–10.5)
nRBC: 0 % (ref 0.0–0.2)

## 2018-08-04 LAB — GLUCOSE, CAPILLARY
Glucose-Capillary: 265 mg/dL — ABNORMAL HIGH (ref 70–99)
Glucose-Capillary: 292 mg/dL — ABNORMAL HIGH (ref 70–99)
Glucose-Capillary: 319 mg/dL — ABNORMAL HIGH (ref 70–99)
Glucose-Capillary: 268 mg/dL — ABNORMAL HIGH (ref 70–99)

## 2018-08-04 LAB — COMPREHENSIVE METABOLIC PANEL
ALT: 70 U/L — ABNORMAL HIGH (ref 0–44)
AST: 62 U/L — ABNORMAL HIGH (ref 15–41)
Albumin: 3.2 g/dL — ABNORMAL LOW (ref 3.5–5.0)
Alkaline Phosphatase: 66 U/L (ref 38–126)
Anion gap: 7 (ref 5–15)
BUN: 16 mg/dL (ref 6–20)
CO2: 17 mmol/L — ABNORMAL LOW (ref 22–32)
Calcium: 8.3 mg/dL — ABNORMAL LOW (ref 8.9–10.3)
Chloride: 112 mmol/L — ABNORMAL HIGH (ref 98–111)
Creatinine, Ser: 0.98 mg/dL (ref 0.61–1.24)
GFR calc Af Amer: >60 mL/min (ref >60)
GFR calc non Af Amer: >60 mL/min (ref >60)
Glucose, Bld: 253 mg/dL — ABNORMAL HIGH (ref 70–99)
Potassium: 3.8 mmol/L (ref 3.5–5.1)
Sodium: 136 mmol/L (ref 135–145)
Total Bilirubin: 0.8 mg/dL (ref 0.3–1.2)
Total Protein: 5.9 g/dL — ABNORMAL LOW (ref 6.5–8.1)

## 2018-08-04 LAB — HEPATITIS PANEL, ACUTE
HCV Ab: <0.1 s/co ratio (ref 0.0–0.9)
Hep A IgM: NEGATIVE
Hep B C IgM: NEGATIVE
Hepatitis B Surface Ag: NEGATIVE

## 2018-08-04 MED ORDER — METOPROLOL TARTRATE 25 MG PO TABS
25.0000 mg | ORAL_TABLET | Freq: Once | ORAL | Status: AC
  Administered 2018-08-04: 25 mg via ORAL
  Filled 2018-08-04: qty 1

## 2018-08-04 MED ORDER — INSULIN GLARGINE 100 UNIT/ML ~~LOC~~ SOLN
20.0000 [IU] | Freq: Every day | SUBCUTANEOUS | Status: DC
  Administered 2018-08-04: 20 [IU] via SUBCUTANEOUS
  Filled 2018-08-04 (×2): qty 0.2

## 2018-08-04 MED ORDER — METOPROLOL TARTRATE 50 MG PO TABS
50.0000 mg | ORAL_TABLET | Freq: Two times a day (BID) | ORAL | Status: DC
  Administered 2018-08-04 – 2018-08-05 (×2): 50 mg via ORAL
  Filled 2018-08-04 (×2): qty 1

## 2018-08-04 MED ORDER — METOPROLOL TARTRATE 50 MG PO TABS: 50.0000 mg | ORAL_TABLET | Freq: Two times a day (BID) | ORAL | Status: DC

## 2018-08-04 NOTE — Progress Notes (Signed)
I began care at 0300 and agree with assessment by previous RN. Will continue to monitor.

## 2018-08-04 NOTE — Progress Notes (Signed)
PROGRESS NOTE    [name]  OEV:[id number] DOB: 1965/04/20 DOA: 08/02/2018 PCP: Patient, No Pcp Per   Brief Narrative:  [name] is a 53 y.o. male with medical history significant of newly diagnosed diabetes mellitus and hypertension Presented at [facility] 08/02/2018 with nausea , vomiting, abdominal pain for 2 days.he was found to be in hyperglycemic state, hypotensive, acute renal insufficiency, elevated LFTs, and diabetic ketoacidotic state with blood sugars of 482, 508, anion gap of 21, creatinine of 2.03  He reported that he has had a recent back injury with a bulging disc, therefore receiving steroid shots to his back, and finished oral steroid taper.  He was started on DKA protocol and he was subsequently transferred under hospitalist service at [facility] however upon arrival to ICU and upon arrival to the ICU, he was found to be in atrial flutter and atrial fibrillation.  Patient does not carry any history of atrial fibrillation.  Slightly hypotensive.  He was given some Cardizem.  Cardiology was consulted.  He was started on IV anticoagulation and oral beta-blockers by cardiology and later on was switched to oral Eliquis.  Consultants:   Cardiology  Procedures:   None  Antimicrobials:   None   Subjective: Patient seen and examined.  He has no complaints at this point in time.  Felt a little palpitation earlier today.  Denies shortness of breath or chest pain.  Objective: Vitals:   08/03/18 1531 08/03/18 1944 08/04/18 0049 08/04/18 0601  BP: 115/70 114/80 117/66 110/74  Pulse: (!) 105 (!) 51 62 69  Resp: 18 17 16 15   Temp: 98 F (36.7 C) 98.9 F (37.2 C) 98.3 F (36.8 C) 98.2 F (36.8 C)  TempSrc: Oral Oral Oral Oral  SpO2: 99% 99% 98% 99%  Weight:    121.6 kg  Height:        Intake/Output Summary (Last 24 hours) at 08/04/2018 0942 Last data filed at 08/04/2018 8299 Gross per 24 hour  Intake 2200.88 ml  Output 1050 ml  Net  1150.88 ml   Filed Weights   08/02/18 1702 08/03/18 0500 08/04/18 0601  Weight: 118.9 kg 119.3 kg 121.6 kg    Examination:  General exam: Appears calm and comfortable  Respiratory system: Clear to auscultation. Respiratory effort normal. Cardiovascular system: S1 & S2 heard, irregularly irregular rate and rhythm. No JVD, murmurs, rubs, gallops or clicks. No pedal edema. Gastrointestinal system: Abdomen is nondistended, soft and nontender. No organomegaly or masses felt. Normal bowel sounds heard. Central nervous system: Alert and oriented. No focal neurological deficits. Extremities: Symmetric 5 x 5 power. Skin: No rashes, lesions or ulcers Psychiatry: Judgement and insight appear normal. Mood & affect appropriate.   Data Reviewed: I have personally reviewed following labs and imaging studies  CBC: Recent Labs  Lab 07/29/18 0732 08/02/18 1054 08/03/18 0536 08/04/18 0352  WBC 7.7 8.7 6.9 5.4  NEUTROABS 3.9  --   --   --   HGB 13.9 14.3 12.2* 11.7*  HCT 42.6 45.2 38.7* 38.7*  MCV 82.2 85.0 85.1 86.0  PLT 169 207 148* 371*   Basic Metabolic Panel: Recent Labs  Lab 08/02/18 1824 08/03/18 0039 08/03/18 0536 08/03/18 0917 08/03/18 1909 08/04/18 0352  NA 138 137 137 137 133* 136  K 3.7 3.6 3.4* 3.6 4.6 3.8  CL 111 112* 112* 111 109 112*  CO2 17* 15* 16* 17* 16* 17*  GLUCOSE 184* 172* 199* 155* 337* 253*  BUN 24*  21* 20 20 21* 16  CREATININE 1.45* 1.20 1.11 1.14 1.09 0.98  CALCIUM 8.5* 8.3* 8.3* 8.4* 8.3* 8.3*  MG 2.3  --   --   --   --   --   PHOS 2.0*  --   --   --   --   --    GFR: Estimated Creatinine Clearance: 125 mL/min (by C-G formula based on SCr of 0.98 mg/dL). Liver Function Tests: Recent Labs  Lab 08/02/18 1054 08/04/18 0352  AST 57* 62*  ALT 65* 70*  ALKPHOS 90 66  BILITOT 1.5* 0.8  PROT 8.2* 5.9*  ALBUMIN 4.5 3.2*   Recent Labs  Lab 08/02/18 1054  LIPASE 50   No results for input(s): AMMONIA in the last 168 hours. Coagulation Profile:  Recent Labs  Lab 08/03/18 0536  INR 1.1   Cardiac Enzymes: Recent Labs  Lab 08/02/18 1824 08/03/18 0039 08/03/18 0536  TROPONINI <0.03 <0.03 <0.03   BNP (last 3 results) No results for input(s): PROBNP in the last 8760 hours. HbA1C: Recent Labs    08/02/18 1824  HGBA1C 13.7*   CBG: Recent Labs  Lab 08/03/18 1032 08/03/18 1128 08/03/18 1622 08/03/18 2103 08/04/18 0730  GLUCAP 245* 185* 251* 330* 265*   Lipid Profile: No results for input(s): CHOL, HDL, LDLCALC, TRIG, CHOLHDL, LDLDIRECT in the last 72 hours. Thyroid Function Tests: Recent Labs    08/02/18 1824  TSH 1.929   Anemia Panel: No results for input(s): VITAMINB12, FOLATE, FERRITIN, TIBC, IRON, RETICCTPCT in the last 72 hours. Sepsis Labs: No results for input(s): PROCALCITON, LATICACIDVEN in the last 168 hours.  Recent Results (from the past 240 hour(s))  SARS Coronavirus 2 (Hosp order,Performed in [facility] lab via Abbott ID)     Status: Abnormal   Collection Time: 08/02/18  1:14 PM  Result Value Ref Range Status   SARS Coronavirus 2 (Abbott ID Now) (A) NEGATIVE Final    INVALID, UNABLE TO DETERMINE THE PRESENCE OF TARGET DNA DUE TO SPECIMEN INTEGRITY. RECOLLECTION REQUESTED.    Comment: (NOTE) Interpretive Result Comment(s): COVID 19 Positive SARS CoV 2 target nucleic acids are DETECTED. The SARS CoV 2 RNA is generally detectable in upper and lower respiratory specimens during the acute phase of infection.  Positive results are indicative of active infection with SARS CoV 2.  Clinical correlation with patient history and other diagnostic information is necessary to determine patient infection status.  Positive results do not rule out bacterial infection or coinfection with other viruses. The expected result is Negative. COVID 19 Negative SARS CoV 2 target nucleic acids are NOT DETECTED. The SARS CoV 2 RNA is generally detectable in upper and lower respiratory specimens during the acute phase  of infection.  Negative results do not preclude SARS CoV 2 infection, do not rule out coinfections with other pathogens, and should not be used as the sole basis for treatment or other patient management decisions.  Negative results must be combined with clinical  observations, patient history, and epidemiological information. The expected result is Negative. Invalid Presence or absence of SARS CoV 2 nucleic acids cannot be determined. Repeat testing was performed on the submitted specimen and repeated Invalid results were obtained.  If clinically indicated, additional testing on a new specimen with an alternate test methodology [id number]) is advised.  The SARS CoV 2 RNA is generally detectable in upper and lower respiratory specimens during the acute phase of infection. The expected result is Negative. Fact Sheet for Patients:  GolfingFamily.no Fact  Sheet for Healthcare Providers: https://www.hernandez-brewer.com/ This test is not yet approved or cleared by the [place] FDA and has been authorized for detection and/or diagnosis of SARS CoV 2 by FDA under an Emergency Use Authorization (EUA).  This EUA will remain in effect (meaning this test can be used) for the duration of the COVID19 d eclaration under Section 564(b)(1) of the Act, 21 U.S.C. section [phone] 3(b)(1), unless the authorization is terminated or revoked sooner. Performed at [facility], [address] [place], [place] [address]   SARS Coronavirus 2 (Hosp order,Performed in [facility] lab via Abbott ID)     Status: None   Collection Time: 08/02/18  1:41 PM  Result Value Ref Range Status   SARS Coronavirus 2 (Abbott ID Now) NEGATIVE NEGATIVE Final    Comment: (NOTE) Interpretive Result Comment(s): COVID 19 Positive SARS CoV 2 target nucleic acids are DETECTED. The SARS CoV 2 RNA is generally detectable in upper and lower respiratory specimens during the acute  phase of infection.  Positive results are indicative of active infection with SARS CoV 2.  Clinical correlation with patient history and other diagnostic information is necessary to determine patient infection status.  Positive results do not rule out bacterial infection or coinfection with other viruses. The expected result is Negative. COVID 19 Negative SARS CoV 2 target nucleic acids are NOT DETECTED. The SARS CoV 2 RNA is generally detectable in upper and lower respiratory specimens during the acute phase of infection.  Negative results do not preclude SARS CoV 2 infection, do not rule out coinfections with other pathogens, and should not be used as the sole basis for treatment or other patient management decisions.  Negative results must be combined with clinical  observations, patient history, and epidemiological information. The expected result is Negative. Invalid Presence or absence of SARS CoV 2 nucleic acids cannot be determined. Repeat testing was performed on the submitted specimen and repeated Invalid results were obtained.  If clinically indicated, additional testing on a new specimen with an alternate test methodology [phone]) is advised.  The SARS CoV 2 RNA is generally detectable in upper and lower respiratory specimens during the acute phase of infection. The expected result is Negative. Fact Sheet for Patients:  GolfingFamily.no Fact Sheet for Healthcare Providers: https://www.hernandez-brewer.com/ This test is not yet approved or cleared by the [place] FDA and has been authorized for detection and/or diagnosis of SARS CoV 2 by FDA under an Emergency Use Authorization (EUA).  This EUA will remain in effect (meaning this test can be used) for the duration of the COVID19 d eclaration under Section 564(b)(1) of the Act, 21 U.S.C. section [phone] 3(b)(1), unless the authorization is terminated or revoked sooner. Performed at  [facility], [address] [place], [place] [address]   MRSA PCR Screening     Status: None   Collection Time: 08/02/18  5:20 PM  Result Value Ref Range Status   MRSA by PCR NEGATIVE NEGATIVE Final    Comment:        The GeneXpert MRSA Assay (FDA approved for NASAL specimens only), is one component of a comprehensive MRSA colonization surveillance program. It is not intended to diagnose MRSA infection nor to guide or monitor treatment for MRSA infections. Performed at [facility], [place] [address], [place], [place] [address]       Radiology Studies: US Abdomen Limited  Result Date: 08/02/2018 CLINICAL DATA:  Upper abdominal pain. EXAM: ULTRASOUND ABDOMEN  LIMITED RIGHT UPPER QUADRANT COMPARISON:  CT abdomen pelvis dated January 30, 2016. FINDINGS: Gallbladder: Multiple small gallstones. No wall thickening visualized. No sonographic Murphy sign noted by sonographer. Common bile duct: Diameter: 4 mm, normal. Liver: No focal lesion identified. Diffusely increased in parenchymal echogenicity. Portal vein is patent on color Doppler imaging with normal direction of blood flow towards the liver. IMPRESSION: 1. Cholelithiasis without sonographic evidence of acute cholecystitis. 2. Hepatic steatosis. Electronically Signed   By: Titus Dubin M.D.   On: 08/02/2018 13:05    Scheduled Meds: . apixaban  5 mg Oral BID  . Chlorhexidine Gluconate Cloth  6 each Topical Daily  . docusate sodium  100 mg Oral BID  . insulin aspart  0-15 Units Subcutaneous TID WC  . insulin aspart  0-5 Units Subcutaneous QHS  . insulin glargine  20 Units Subcutaneous Daily  . insulin starter kit- pen needles  1 kit Other Once  . metoprolol tartrate  25 mg Oral Once  . metoprolol tartrate  50 mg Oral BID  . sodium chloride flush  3 mL Intravenous Q12H  . tamsulosin  0.4 mg Oral PC supper   Continuous Infusions: . 0.9 % NaCl with KCl 40 mEq / L 75 mL/hr (08/04/18 0014)  .  dextrose 5 % and 0.45% NaCl Stopped (08/03/18 1120)     LOS: 2 days   Assessment & Plan:   Principal Problem:   DKA (diabetic ketoacidoses) ([place]) Active Problems:   HTN (hypertension)   Hypotension   AKI (acute kidney injury) ([place])   Elevated LFTs   DKA (diabetic ketoacidoses) ([facility])/with recently diagnosed of diabetes mellitus type 2: Patient's DKA has resolved as his anion gap has closed.  Blood sugar still slightly elevated.  Will increase Lantus to 20 units and continue signs concerning.  DC IV fluids.  Nausea /vomiting: Secondary to DKA.  Resolved.  Hypotension /recently diagnosed of hypertension-blood pressure fairly normal.  Holding home lisinopril.  Increasing metoprolol to 50 mg twice daily to better control his heart rate.  AKI (acute kidney injury) ([place]): Secondary to dehydration due to DKA.  Resolved.  Elevated LFTs: Repeat LFTs today and negative for viral hepatitis panel.  Chronic back pain, disc bulging questionable rupture/exacerbation needs to sciatic pain -Patient is being followed as an outpatient with steroid shots, status post tapered p.o. steroids treatment -Continue PRN analgesics-PT  Atrial flutter -new onset: Still in flutter.  According to telemetry report, he has been running anywhere between 65 and 140 with mostly being around 105.  We will increase his metoprolol to 50 mg twice daily and monitor on telemetry.  He has been started on Eliquis by cardiology.  DVT prophylaxis: Eliquis Code Status: Full code Family Communication: Discussed with patient Disposition Plan: Monitor overnight.   Time spent: 27 min    Darliss Cheney, MD [facility] Pager [phone]  If 7PM-7AM, please contact night-coverage [email or web address] Password [facility] 08/04/2018, 9:42 AM

## 2018-08-04 NOTE — Progress Notes (Signed)
Initial Nutrition Assessment  RD working remotely.   DOCUMENTATION CODES:   Obesity unspecified  INTERVENTION:  - continue to encourage PO intakes. - continue to educate on DM-related topics. - provided DM education over the phone and will place handouts in Discharge Instructions.    NUTRITION DIAGNOSIS:   Food and nutrition related knowledge deficit related to other (see comment)(no previous diet education) as evidenced by other (comment)(consulting indicating need for education following new dx of DM).  GOAL:   Patient will meet greater than or equal to 90% of their needs  MONITOR:   PO intake, Labs, Weight trends  REASON FOR ASSESSMENT:   Malnutrition Screening Tool, Consult Diet education  ASSESSMENT:   53 y.o. male with medical history significant of newly diagnosed DM and HTN. Patient presented to [facility] on 5/15 reporting N/V and abdominal pain x2 days. Patient found to be in hyperglycemic state, hypotensive, acute renal insufficiency, elevated LFTs, and DKA state with blood sugars of 482 and 508 mg/dl, anion gap of 21. He reported a recent back injury which required steroid shots in his back and that he had recently finished a steroid taper. Of note, patient was dx with DM in the ED on 5/11.  Per RN flow sheet, patient consumed 100% of dinner last night and 75% of breakfast this AM. Patient reports he does not care for many of the items served in the hospital and that he has been receiving food from outside of the hospital for most of his meals. He reports being vegetarian for the past 20+ years and that protein sources include eggs, nuts (specifically peanuts), and vegetarian options such as Gard[employer]in and Morning Star. Patient does plan to add in baked chicken and [place] d/t new dx of DM. Talked with patient about this change.   Talked with him about carbohydrates and carbohydrate sources and ensuring that he spread carbs out throughout the day and be mindful  of portion sizes. Patient typically eats a small meal for breakfast, a large meal for a late lunch, and then snacks between those meals and in the evening. Discussed number of carbs per meal and snack and patient also asked about beverages; this was discussed and options provided.   Expect good compliance.   Patient has been seen by DM Coordinator this admission. Patient denies any additional questions or concerns at this time. Informed him to let MD or nurse know should he have further questions and that they can alert RD.    Medications reviewed; 100 mg colace BID, sliding scale novolog, 20 units lantus/day. Labs reviewed; CBGs: 265 and 292 mg/dl today, Cl: 438 mmol/l, Ca: 8.3 mg/dl, AST/ALT slightly elevated.         NUTRITION - FOCUSED PHYSICAL EXAM:  unable to perform at this time.   Diet Order:   Diet Order            Diet Carb Modified Fluid consistency: Thin; Room service appropriate? Yes  Diet effective now              EDUCATION NEEDS:   Education needs have been addressed  Skin:  Skin Assessment: Reviewed RN Assessment  Last BM:  5/16  Height:   Ht Readings from Last 1 Encounters:  08/02/18 6\' 2"  (1.88 m)    Weight:   Wt Readings from Last 1 Encounters:  08/04/18 121.6 kg    Ideal Body Weight:  86.4 kg  BMI:  Body mass index is 34.42 kg/m.  Estimated Nutritional  Needs:   Kcal:  [id number] kcal  Protein:  105-120 grams  Fluid:  >/= 2.2 L/day     Trenton GammonJessica Dymin Dingledine, MS, RD, LDN, [facility] Inpatient Clinical Dietitian Pager # [phone] After hours/weekend pager # [phone]

## 2018-08-04 NOTE — Progress Notes (Signed)
Progress Note  Patient Name: [name] Date of Encounter: 08/04/2018  Primary Cardiologist: [place] better with hydration No cardiac symptoms   Inpatient Medications    Scheduled Meds: . apixaban  5 mg Oral BID  . Chlorhexidine Gluconate Cloth  6 each Topical Daily  . docusate sodium  100 mg Oral BID  . insulin aspart  0-15 Units Subcutaneous TID WC  . insulin aspart  0-5 Units Subcutaneous QHS  . insulin glargine  15 Units Subcutaneous QHS  . insulin starter kit- pen needles  1 kit Other Once  . metoprolol tartrate  25 mg Oral BID  . sodium chloride flush  3 mL Intravenous Q12H  . tamsulosin  0.4 mg Oral PC supper   Continuous Infusions: . 0.9 % NaCl with KCl 40 mEq / L 75 mL/hr (08/04/18 0014)  . dextrose 5 % and 0.45% NaCl Stopped (08/03/18 1120)   PRN Meds: acetaminophen **OR** acetaminophen, alum & mag hydroxide-simeth, HYDROcodone-acetaminophen, ketorolac, levalbuterol, magnesium citrate, ondansetron **OR** ondansetron (ZOFRAN) IV, oxyCODONE, senna-docusate, sorbitol   Vital Signs    Vitals:   08/03/18 1531 08/03/18 1944 08/04/18 0049 08/04/18 0601  BP: 115/70 114/80 117/66 110/74  Pulse: (!) 105 (!) 51 62 69  Resp: _0 Temp: 98 F (36.7 C) 98.9 F (37.2 C) 98.3 F (36.8 C) 98.2 F (36.8 C)  TempSrc: Oral Oral Oral Oral  SpO2: 99% 99% 98% 99%  Weight:    121.6 kg  Height:        Intake/Output Summary (Last 24 hours) at 08/04/2018 0814 Last data filed at 08/04/2018 8299 Gross per 24 hour  Intake 2200.88 ml  Output 1050 ml  Net 1150.88 ml   Last 3 Weights 08/04/2018 08/03/2018 08/02/2018  Weight (lbs) 268 lb 1.3 oz 263 lb 0.1 oz 262 lb 2 oz  Weight (kg) 121.6 kg 119.3 kg 118.9 kg      Telemetry    Flutter rate 80-90  Personally Reviewed  ECG    Flutter no acute ECG changes  - Personally Reviewed  Physical Exam  Obese black male  GEN: No acute distress.   Neck: No JVD Cardiac: RRR, no murmurs, rubs, or  gallops.  Respiratory: Clear to auscultation bilaterally. GI: Soft, nontender, non-distended  MS: No edema; No deformity. Neuro:  Nonfocal  Psych: Normal affect   Labs    Chemistry Recent Labs  Lab 08/02/18 1054  08/03/18 0917 08/03/18 1909 08/04/18 0352  NA 131*   < > 137 133* 136  K 4.2   < > 3.6 4.6 3.8  CL 100   < > 111 109 112*  CO2 11*   < > 17* 16* 17*  GLUCOSE 508*   < > 155* 337* 253*  BUN 25*   < > 20 21* 16  CREATININE 2.03*   < > 1.14 1.09 0.98  CALCIUM 9.3   < > 8.4* 8.3* 8.3*  PROT 8.2*  --   --   --  5.9*  ALBUMIN 4.5  --   --   --  3.2*  AST 57*  --   --   --  62*  ALT 65*  --   --   --  70*  ALKPHOS 90  --   --   --  66  BILITOT 1.5*  --   --   --  0.8  GFRNONAA 37*   < > >60 >60 >60  GFRAA 43*   < > >  60 >60 >60  ANIONGAP 20*   < > _0 < > = values in this interval not displayed.     Hematology Recent Labs  Lab 08/02/18 1054 08/03/18 0536 08/04/18 0352  WBC 8.7 6.9 5.4  RBC 5.32 4.55 4.50  HGB 14.3 12.2* 11.7*  HCT 45.2 38.7* 38.7*  MCV 85.0 85.1 86.0  MCH 26.9 26.8 26.0  MCHC 31.6 31.5 30.2  RDW 13.3 13.4 13.4  PLT 207 148* 142*    Cardiac Enzymes Recent Labs  Lab 08/02/18 1824 08/03/18 0039 08/03/18 0536  TROPONINI <0.03 <0.03 <0.03   No results for input(s): TROPIPOC in the last 168 hours.   BNPNo results for input(s): BNP, PROBNP in the last 168 hours.   DDimer No results for input(s): DDIMER in the last 168 hours.   Radiology    US Abdomen Limited  Result Date: 08/02/2018 CLINICAL DATA:  Upper abdominal pain. EXAM: ULTRASOUND ABDOMEN LIMITED RIGHT UPPER QUADRANT COMPARISON:  CT abdomen pelvis dated January 30, 2016. FINDINGS: Gallbladder: Multiple small gallstones. No wall thickening visualized. No sonographic Murphy sign noted by sonographer. Common bile duct: Diameter: 4 mm, normal. Liver: No focal lesion identified. Diffusely increased in parenchymal echogenicity. Portal vein is patent on color Doppler imaging with  normal direction of blood flow towards the liver. IMPRESSION: 1. Cholelithiasis without sonographic evidence of acute cholecystitis. 2. Hepatic steatosis. Electronically Signed   By: Titus Dubin M.D.   On: 08/02/2018 13:05    Cardiac Studies   Echo EF 50-55%   Patient Profile     53 y.o. male admitted with flutter in setting of DKA and recent steroid injection to back  Assessment & Plan    Flutter :  Rate control good with lopressor titrate dose as needed as BP better with hydration started on eliquis EF 50-55% no need for TEE/DCC. Continue meds and f/u as outpatient with Dr Irish Lack If still in flutter 3 weeks can arrange outpatient [facility]  Note echo commented on pacing wire but this was artifact external to heart   DKA:  Hydrated Morning sugars still over 200 plan per primary service   Back pain :  Avoid steroid injection on Toradol   OK to d/c home when on stable DM regimen        For questions or updates, please contact [facility] Please consult [email or web address] for contact info under        Signed, Jenkins Rouge, MD  08/04/2018, 8:14 AM

## 2018-08-04 NOTE — Progress Notes (Signed)
Pt heart rate has risen to 140's- low 150's multiple times this shift with activity and will sustain that rate for up to 15-20 minutes after activity is finished. The on call cardiology fellow Dr. Okey Dupre was notified and no new orders are placed at this time. Pt remains asymptomatic and vital signs are stable at this time. Will continue to monitor.

## 2018-08-04 NOTE — Progress Notes (Signed)
Pt HR up to 150's w/ ambulation, down to 110's -120's at rest. Pt asymptomatic MD made aware. Will continue to monitor.

## 2018-08-04 NOTE — Plan of Care (Signed)

## 2018-08-04 NOTE — TOC Progression Note (Signed)
Transition of Care [facility]) - Progression Note    Patient Details  Name: [name] MRN: [id number] Date of Birth: 1966/02/10  [facility] CM/SW Contact  Coralyn Helling, [place] Phone Number: 08/04/2018, 8:46 AM  Clinical Narrative:   LCSW received consult for dc planning. LCSW reviewed chart and spoke with floor RN. Patient has now CSW needs at this time. LCSW signing off.          Expected Discharge Plan and Services                                                 Social Determinants of Health (SDOH) Interventions    Readmission Risk Interventions No flowsheet data found.

## 2018-08-05 ENCOUNTER — Encounter (HOSPITAL_COMMUNITY): Payer: Self-pay | Admitting: Cardiology

## 2018-08-05 DIAGNOSIS — R945 Abnormal results of liver function studies: Secondary | ICD-10-CM

## 2018-08-05 DIAGNOSIS — I4891 Unspecified atrial fibrillation: Secondary | ICD-10-CM

## 2018-08-05 LAB — GLUCOSE, CAPILLARY
Glucose-Capillary: 255 mg/dL — ABNORMAL HIGH (ref 70–99)
Glucose-Capillary: 368 mg/dL — ABNORMAL HIGH (ref 70–99)

## 2018-08-05 LAB — COMPREHENSIVE METABOLIC PANEL
ALT: 85 U/L — ABNORMAL HIGH (ref 0–44)
AST: 66 U/L — ABNORMAL HIGH (ref 15–41)
Albumin: 3.5 g/dL (ref 3.5–5.0)
Alkaline Phosphatase: 70 U/L (ref 38–126)
Anion gap: 7 (ref 5–15)
BUN: 13 mg/dL (ref 6–20)
CO2: 21 mmol/L — ABNORMAL LOW (ref 22–32)
Calcium: 8.8 mg/dL — ABNORMAL LOW (ref 8.9–10.3)
Chloride: 108 mmol/L (ref 98–111)
Creatinine, Ser: 0.91 mg/dL (ref 0.61–1.24)
GFR calc Af Amer: >60 mL/min (ref >60)
GFR calc non Af Amer: >60 mL/min (ref >60)
Glucose, Bld: 237 mg/dL — ABNORMAL HIGH (ref 70–99)
Potassium: 3.5 mmol/L (ref 3.5–5.1)
Sodium: 136 mmol/L (ref 135–145)
Total Bilirubin: 0.6 mg/dL (ref 0.3–1.2)
Total Protein: 6.3 g/dL — ABNORMAL LOW (ref 6.5–8.1)

## 2018-08-05 LAB — CBC
HCT: 41.1 % (ref 39.0–52.0)
Hemoglobin: 13 g/dL (ref 13.0–17.0)
MCH: 26.6 pg (ref 26.0–34.0)
MCHC: 31.6 g/dL (ref 30.0–36.0)
MCV: 84 fL (ref 80.0–100.0)
Platelets: 134 10*3/uL — ABNORMAL LOW (ref 150–400)
RBC: 4.89 MIL/uL (ref 4.22–5.81)
RDW: 13.2 % (ref 11.5–15.5)
WBC: 5.5 10*3/uL (ref 4.0–10.5)
nRBC: 0 % (ref 0.0–0.2)

## 2018-08-05 MED ORDER — METOPROLOL TARTRATE 50 MG PO TABS: 50.0000 mg | ORAL_TABLET | Freq: Two times a day (BID) | ORAL | 0 refills | Status: DC

## 2018-08-05 MED ORDER — APIXABAN 5 MG PO TABS: 5.0000 mg | ORAL_TABLET | Freq: Two times a day (BID) | ORAL | 0 refills | Status: DC

## 2018-08-05 MED ORDER — SITAGLIPTIN PHOS-METFORMIN HCL 50-500 MG PO TABS: 1.0000 | ORAL_TABLET | Freq: Two times a day (BID) | ORAL | 0 refills | Status: DC

## 2018-08-05 MED ORDER — INSULIN GLARGINE 100 UNIT/ML ~~LOC~~ SOLN
30.0000 [IU] | Freq: Every day | SUBCUTANEOUS | Status: DC
  Administered 2018-08-05: 11:00:00 30 [IU] via SUBCUTANEOUS
  Filled 2018-08-05: qty 0.3

## 2018-08-05 MED ORDER — INSULIN STARTER KIT- PEN NEEDLES (ENGLISH): 1.0000 | Freq: Once | Status: DC

## 2018-08-05 MED ORDER — GLIPIZIDE 5 MG PO TABS: 5.0000 mg | ORAL_TABLET | Freq: Two times a day (BID) | ORAL | 11 refills | Status: DC

## 2018-08-05 NOTE — Progress Notes (Signed)
Progress Note  Patient Name: [name] Date of Encounter: 08/05/2018  Primary Cardiologist: [name], MD   Subjective   No chest pain or SOB.  Is aware of heart beat at rest, no awareness of heart beat with activity.   Inpatient Medications    Scheduled Meds: . apixaban  5 mg Oral BID  . Chlorhexidine Gluconate Cloth  6 each Topical Daily  . docusate sodium  100 mg Oral BID  . insulin aspart  0-15 Units Subcutaneous TID WC  . insulin aspart  0-5 Units Subcutaneous QHS  . insulin glargine  20 Units Subcutaneous Daily  . insulin starter kit- pen needles  1 kit Other Once  . metoprolol tartrate  50 mg Oral BID  . sodium chloride flush  3 mL Intravenous Q12H  . tamsulosin  0.4 mg Oral PC supper   Continuous Infusions: . dextrose 5 % and 0.45% NaCl Stopped (08/03/18 1120)   PRN Meds: acetaminophen **OR** acetaminophen, alum & mag hydroxide-simeth, HYDROcodone-acetaminophen, ketorolac, levalbuterol, magnesium citrate, ondansetron **OR** ondansetron (ZOFRAN) IV, oxyCODONE, senna-docusate, sorbitol   Vital Signs    Vitals:   08/04/18 0601 08/04/18 1403 08/04/18 2133 08/05/18 0446  BP: 110/74 125/78 (!) 149/89 (!) 127/95  Pulse: 69 98 62   Resp: _0 Temp: 98.2 F (36.8 C) 98.2 F (36.8 C) 98.9 F (37.2 C) 98.1 F (36.7 C)  TempSrc: Oral Oral Oral Oral  SpO2: 99% 100% 100%   Weight: 121.6 kg     Height:        Intake/Output Summary (Last 24 hours) at 08/05/2018 0828 Last data filed at 08/04/2018 1955 Gross per 24 hour  Intake 1160 ml  Output 400 ml  Net 760 ml   Last 3 Weights 08/04/2018 08/03/2018 08/02/2018  Weight (lbs) 268 lb 1.3 oz 263 lb 0.1 oz 262 lb 2 oz  Weight (kg) 121.6 kg 119.3 kg 118.9 kg      Telemetry    A flutter with RVR  - Personally Reviewed  ECG    No new - Personally Reviewed  Physical Exam     Labs    Chemistry Recent Labs  Lab 08/02/18 1054  08/03/18 1909 08/04/18 0352 08/05/18 0323  NA 131*   < > 133*  136 136  K 4.2   < > 4.6 3.8 3.5  CL 100   < > 109 112* 108  CO2 11*   < > 16* 17* 21*  GLUCOSE 508*   < > 337* 253* 237*  BUN 25*   < > 21* 16 13  CREATININE 2.03*   < > 1.09 0.98 0.91  CALCIUM 9.3   < > 8.3* 8.3* 8.8*  PROT 8.2*  --   --  5.9* 6.3*  ALBUMIN 4.5  --   --  3.2* 3.5  AST 57*  --   --  62* 66*  ALT 65*  --   --  70* 85*  ALKPHOS 90  --   --  66 70  BILITOT 1.5*  --   --  0.8 0.6  GFRNONAA 37*   < > >60 >60 >60  GFRAA 43*   < > >60 >60 >60  ANIONGAP 20*   < > _1 < > = values in this interval not displayed.     Hematology Recent Labs  Lab 08/03/18 0536 08/04/18 0352 08/05/18 0323  WBC 6.9 5.4 5.5  RBC 4.55 4.50 4.89  HGB 12.2* 11.7*  13.0  HCT 38.7* 38.7* 41.1  MCV 85.1 86.0 84.0  MCH 26.8 26.0 26.6  MCHC 31.5 30.2 31.6  RDW 13.4 13.4 13.2  PLT 148* 142* 134*    Cardiac Enzymes Recent Labs  Lab 08/02/18 1824 08/03/18 0039 08/03/18 0536  TROPONINI <0.03 <0.03 <0.03   No results for input(s): TROPIPOC in the last 168 hours.   BNPNo results for input(s): BNP, PROBNP in the last 168 hours.   DDimer No results for input(s): DDIMER in the last 168 hours.   Radiology    No results found.  Cardiac Studies   Echo 08/03/18 IMPRESSIONS    1. The left ventricle has low normal systolic function, with an ejection fraction of 50-55%. The cavity size was normal. There is mild concentric left ventricular hypertrophy. Left ventricular diastolic function could not be evaluated secondary to  atrial flutter.  2. The right ventricle has normal systolic function. The cavity was normal. There is no increase in right ventricular wall thickness.  3. The aortic valve was not well visualized. 4. There is a pacer wire in the RV cavity.  FINDINGS  Left Ventricle: The left ventricle has low normal systolic function, with an ejection fraction of 50-55%. The cavity size was normal. There is mild concentric left ventricular hypertrophy. Left ventricular diastolic  function could not be evaluated  secondary to atrial fibrillation. Left ventricular diffuse hypokinesis.  Right Ventricle: The right ventricle has normal systolic function. The cavity was normal. There is no increase in right ventricular wall thickness.  Left Atrium: Left atrial size was normal in size.  Right Atrium: Right atrial size was normal in size. Right atrial pressure is estimated at 3 mmHg.  Interatrial Septum: No atrial level shunt detected by color flow Doppler.  Pericardium: There is no evidence of pericardial effusion.  Mitral Valve: The mitral valve is normal in structure. Mitral valve regurgitation was not assessed by color flow Doppler.  Tricuspid Valve: The tricuspid valve is normal in structure. Tricuspid valve regurgitation was not visualized by color flow Doppler.  Aortic Valve: The aortic valve was not well visualized Aortic valve regurgitation was not visualized by color flow Doppler.  Pulmonic Valve: The pulmonic valve was normal in structure. Pulmonic valve regurgitation is not visualized by color flow Doppler.  Venous: The inferior vena cava is normal in size with greater than 50% respiratory variability.      Patient Profile     53 y.o. male admitted with flutter in setting of DKA and recent steroid injection to back and hx of HTN, new diabetes.     Assessment & Plan    A flutter --with exertion rate is elevated to 150.  At rest down to 110-120s.   Pt on lopressor 50 mg BID  with improved rate control.  Per Dr. Johnsie Cancel no need for TEE/DCCV with rate easily controlled.  ? Increase BB or add dilt? Will defer to Dr. Angelena Form.  Anticoagulation on Eliquis.--CHA2DS2VASc of 2   HTN BP 127/95 new Dx as well  DKA per IM.  New dx of diabetes.  Back pain --avoid steroid injection.  ? Pacer wire in RV cavity ? Misprint in Echo no recent CXR  Pt denies pacer or any knowledge of wire in his heart.  Cephid test for COVID neg.      For questions  or updates, please contact [place] Please consult [email or web address] for contact info under       Signed, Cecilie Kicks, NP  08/05/2018, 8:28 AM  I have personally seen and examined this patient. I agree with the assessment and plan as outlined above. I physically entered this patient's room and examined him.  New onset atrial flutter. He is now on Eliquis. He converted to sinus this am. Would continue Lopressor and Eliquis. Should be ok to discharge from a cardiac standpoint. We can arrange follow up in our office with Dr. Irish Lack.   Lauree Chandler 08/05/2018 10:22 AM

## 2018-08-05 NOTE — Progress Notes (Addendum)
Inpatient Diabetes Program Recommendations  AACE/ADA: New Consensus Statement on Inpatient Glycemic Control (2015)  Target Ranges:  Prepandial:   less than 140 mg/dL      Peak postprandial:   less than 180 mg/dL (1-2 hours)      Critically ill patients:  140 - 180 mg/dL   Results for [name], [name] (MRN [id number]) as of 08/05/2018 09:04  Ref. Range 08/04/2018 07:30 08/04/2018 12:27 08/04/2018 16:23 08/04/2018 21:33  Glucose-Capillary Latest Ref Range: 70 - 99 mg/dL 265 (H)  8 units NOVOLOG  292 (H)  8 units NOVOLOG +  20 units LANTUS given at 11am  319 (H)  11 units NOVOLOG  268 (H)  3 units NOVOLOG    Results for [name], [name] (MRN [id number]) as of 08/05/2018 09:04  Ref. Range 08/05/2018 08:03  Glucose-Capillary Latest Ref Range: 70 - 99 mg/dL 255 (H)   Results for [name], [name] (MRN [id number]) as of 08/05/2018 09:04  Ref. Range 08/02/2018 18:24  Hemoglobin A1C Latest Ref Range: 4.8 - 5.6 % 13.7 (H)  (346 mg/dl)    Admit with: DKA (glucose 508, Anion Gap 20, CO2 11)  History: Recent Diagnosis of DM (made in the ED on 05/11--Sent home with Rx for Metformin)  Home DM Meds: Metformin 500 mg BID  Current Orders: Lantus 20 units Daily      Novolog Moderate Correction Scale/ SSI (0-15 units) TID AC + HS     PCP: Mackie Pai, PA with [facility] (seen 08/02/2018)  Recent Epidural steroids and oral steroids for Back issues which may have precipitated Hyperglycemia/Diabetes.  Transitioned off the IV Insulin drip to Lantus and Novolog on 05/16.  Lantus dose increased yesterday AM.     MD- Please consider the following in-hospital insulin adjustments:  1. Increase Lantus to 25 units Daily (0.2 units/kg dosing based on weight of 121 kg)  If dose already given this AM, please order an extra 5 units Lantus X 1 dose to be given this AM as well    2. Start Novolog Meal Coverage: Novolog 6 units TID with meals  (Please add the following Hold Parameters: Hold if pt  eats <50% of meal, Hold if pt NPO)    3. Given A1c of 13.7% and admission with DKA, pt will need Insulin for home.  Likely needs both Lantus and Novolog.  Plan to see pt today to discuss new diagnosis and going home on insulin.   Patient will need the following Rxs for discharge:  1. Lantus insulin pen- Order # [id number] 2. Novolog insulin pen- Order # [id number] 5. Insulin pen needles- Order # [id number] 0. CBG meter and supplies- Order # [id number]    Addendum 2pm- Spoke with pt about new diagnosis.  Discussed A1C results with him and explained what an A1C is, basic pathophysiology of DM Type 2, basic home care, basic diabetes diet nutrition principles, importance of checking CBGs and maintaining good CBG control to prevent long-term and short-term complications.  Reviewed signs and symptoms of hyperglycemia and hypoglycemia and how to treat hypoglycemia at home.  Also reviewed blood sugar goals and A1c goals for home.  Also discussed the two different insulins Lantus and Novolog with pt: What they are, how to take, when to take, what to do with missed doses, side effects, etc.  Strongly encouraged pt to go see PCP either in person or virtual visit soon after d/c to discuss new diabetes diagnosis and new insulins at d/c.  Pt had questions about how steroids  affect CBGs.  Discussed with pt that steroids (oral or injection) can raise the CBGs and that he should call his PCP for instructions on what to do with his insulin doses while taking steroids as he may likely need more insulin while getting steroids (needs back up plan for elevated CBGs while getting steroids).     RNs to provide ongoing basic DM education at bedside with this patient.  Have ordered educational booklet, insulin starter kit, and DM videos.  Have also placed RD consult for DM diet education for this patient.  Educated patient on insulin pen use at home.  Reviewed all steps of insulin pen including attachment of needle, 2-unit air shot,  dialing up dose, giving injection, rotation of injection sites, removing needle, disposal of sharps, storage of unused insulin, disposal of insulin etc.  Patient able to provide successful return demonstration.  MD to give patient Rxs for insulin pens and insulin pen needles.         --Will follow patient during hospitalization--  Wyn Quaker RN, MSN, CDE Diabetes Coordinator Inpatient Glycemic Control Team Team Pager: [phone] (8a-5p)

## 2018-08-05 NOTE — Progress Notes (Signed)
Teaching instructions given on how to take his own pulse and on how to give himself his insulin injections. Verbalized and returned demonstration. Melton Alar, RN

## 2018-08-05 NOTE — Discharge Summary (Signed)
Physician Discharge Summary  Jonathan FiddlerHorace L Walls WUJ:[id number] DOB: [birth year] DOA: 08/02/2018  PCP: Patient, No Pcp Per  Admit date: 08/02/2018 Discharge date: 08/05/2018  Admitted From: Home Disposition: Home  Recommendations for Outpatient Follow-up:  1. Follow up with PCP in 1-2 weeks 2. Follow with cardiology in 1 to 2 weeks 3. Please obtain BMP/CBC in one week 4. Please follow up on the following pending results:  Home Health: None Equipment/Devices: None  Discharge Condition: Stable CODE STATUS: Full code Diet recommendation: Heart healthy  Subjective: Seen and examined.  No complaints.  Denies any chest pain or palpitation or shortness of breath.  Brief/Interim Summary: [name] a 52 y.o.malewith medical history significant ofnewly diagnosed diabetes mellitus and hypertension [facility] with nausea ,vomiting, abdominal pain for 2 days.he was found to be in hyperglycemic state, hypotensive, acute renal insufficiency, elevated LFTs,and diabetic ketoacidotic state with blood sugars of 482, 508, anion gap of21,creatinine of 2.03  He reported that he has had a recent back injury with a bulging disc, therefore receiving steroid shots to his back, and finished oral steroid taper.  He was started on DKA protocol and he was subsequently transferred [facility] however upon arrival to ICU and upon arrival to the ICU, he was found to be in atrial flutter and atrial fibrillation.  Patient does not carry any history of atrial fibrillation.  Slightly hypotensive.  He was given some Cardizem.  Cardiology was consulted.  He was started on IV anticoagulation and oral beta-blockers by cardiology and later on was switched to oral Eliquis.  His DKA was treated per protocol with insulin drip and IV fluids and subsequently he was started on long-acting insulin with sliding scale insulin and his DKA had resolved within 24  hours and his blood sugar has remained fairly controlled for last 2 days.  His heart rate was initially higher on low-dose of beta-blocker metoprolol which was then increased to 50 mg twice daily and his heart rates have remained controlled since then and patient has remained completely asymptomatic since past 2 days.  He had pause x2 each of 2.3 seconds.  He was seen by cardiology and they have cleared him to be discharged.  I also personally discussed the case with Dr. Eli HoseMcAlney from cardiology who also cleared the patient to be discharged home.  They will see him as an outpatient.  He is going to be discharged on Eliquis.  Of note, for his diabetes, he was taking metformin 500 mg twice daily and I have changed that to Janumet 50/500 twice daily and have added glipizide 5 mg p.o. twice daily.  Discharge Diagnoses:  Principal Problem:   DKA (diabetic ketoacidoses) (HCC) Active Problems:   HTN (hypertension)   Hypotension   AKI (acute kidney injury) (HCC)   Elevated LFTs   New onset a-fib [facility](HCC)    Discharge Instructions  Discharge Instructions    Discharge patient   Complete by:  As directed    Discharge disposition:  01-Home or Self Care   Discharge patient date:  08/05/2018     Allergies as of 08/05/2018      Reactions   Metformin And Related Nausea And Vomiting, Other (See Comments)   Altered mental state      Medication List    STOP taking these medications   meloxicam 15 MG tablet Commonly known as:  MOBIC   metFORMIN 500 MG tablet Commonly known as:  GLUCOPHAGE  methylPREDNISolone 4 MG tablet Commonly known as:  Medrol     TAKE these medications   apixaban 5 MG Tabs tablet Commonly known as:  ELIQUIS Take 1 tablet (5 mg total) by mouth 2 (two) times daily for 30 days.   cyclobenzaprine 10 MG tablet Commonly known as:  FLEXERIL Take 1 tablet (10 mg total) by mouth 3 (three) times daily as needed for muscle spasms.   glipiZIDE 5 MG tablet Commonly known as:   GLUCOTROL Take 1 tablet (5 mg total) by mouth 2 (two) times daily.   lidocaine 5 % Commonly known as:  Lidoderm Place 1 patch onto the skin daily. Remove & Discard patch within 12 hours or as directed by MD   lisinopril 20 MG tablet Commonly known as:  ZESTRIL Take 1 tablet (20 mg total) by mouth daily.   methocarbamol 500 MG tablet Commonly known as:  ROBAXIN Take 1 tablet (500 mg total) by mouth 2 (two) times daily.   metoprolol tartrate 50 MG tablet Commonly known as:  LOPRESSOR Take 1 tablet (50 mg total) by mouth 2 (two) times daily for 30 days.   ondansetron 4 MG disintegrating tablet Commonly known as:  Zofran ODT Take 1 tablet (4 mg total) by mouth every 8 (eight) hours as needed for nausea or vomiting.   Oxycodone HCl 10 MG Tabs Take 1 tablet (10 mg total) by mouth every 4 (four) hours as needed.   oxyCODONE-acetaminophen 5-325 MG tablet Commonly known as:  PERCOCET/ROXICET Take 1-2 tablets by mouth every 6 (six) hours as needed for severe pain.   sitaGLIPtin-metformin 50-500 MG tablet Commonly known as:  Janumet Take 1 tablet by mouth 2 (two) times daily with a meal.   tamsulosin 0.4 MG Caps capsule Commonly known as:  FLOMAX Take 1 capsule (0.4 mg total) by mouth daily after supper.   TART CHERRY ADVANCED PO Take 3 tablets by mouth daily.   testosterone cypionate 200 MG/ML injection Commonly known as:  DEPOTESTOSTERONE CYPIONATE Inject 200 mg into the muscle See admin instructions. yearly   traMADol 50 MG tablet Commonly known as:  ULTRAM Take 1 tablet (50 mg total) by mouth every 6 (six) hours as needed for moderate pain or severe pain.      Follow-up Information    Corky Crafts, MD Follow up on 08/19/2018.   Specialties:  Cardiology, Radiology, Interventional Cardiology Why:  at 0900 AM in the office.  we will need Ekg.  this will be with his Nurse Practitioner Nada Boozer, NP Contact information: [address] [place]  [place] [address] [phone]          Allergies  Allergen Reactions  . Metformin And Related Nausea And Vomiting and Other (See Comments)    Altered mental state    Consultations: Cardiology   Procedures/Studies: US Abdomen Limited  Result Date: 08/02/2018 CLINICAL DATA:  Upper abdominal pain. EXAM: ULTRASOUND ABDOMEN LIMITED RIGHT UPPER QUADRANT COMPARISON:  CT abdomen pelvis dated January 30, 2016. FINDINGS: Gallbladder: Multiple small gallstones. No wall thickening visualized. No sonographic Murphy sign noted by sonographer. Common bile duct: Diameter: 4 mm, normal. Liver: No focal lesion identified. Diffusely increased in parenchymal echogenicity. Portal vein is patent on color Doppler imaging with normal direction of blood flow towards the liver. IMPRESSION: 1. Cholelithiasis without sonographic evidence of acute cholecystitis. 2. Hepatic steatosis. Electronically Signed   By: Obie Dredge M.D.   On: 08/02/2018 13:05     Discharge Exam: Vitals:   08/05/18  0446 08/05/18 1441  BP: (!) 127/95 (!) 132/95  Pulse:  75  Resp: 18 18  Temp: 98.1 F (36.7 C) 98.4 F (36.9 C)  SpO2:  100%   Vitals:   08/04/18 1403 08/04/18 2133 08/05/18 0446 08/05/18 1441  BP: 125/78 (!) 149/89 (!) 127/95 (!) 132/95  Pulse: 98 62  75  Resp: Temp: 98.2 F (36.8 C) 98.9 F (37.2 C) 98.1 F (36.7 C) 98.4 F (36.9 C)  TempSrc: Oral Oral Oral Oral  SpO2: 100% 100%  100%  Weight:      Height:        General: Pt is alert, awake, not in acute distress Cardiovascular: RRR, S1/S2 +, no rubs, no gallops Respiratory: CTA bilaterally, no wheezing, no rhonchi Abdominal: Soft, NT, ND, bowel sounds + Extremities: no edema, no cyanosis    The results of significant diagnostics from this hospitalization (including imaging, microbiology, ancillary and laboratory) are listed below for reference.     Microbiology: Recent Results (from the past 240 hour(s))  SARS Coronavirus 2 (Hosp  order,Performed in [facility] lab via [employer] ID)     Status: Abnormal   Collection Time: 08/02/18  1:14 PM  Result Value Ref Range Status   SARS Coronavirus 2 ([employer] ID Now) (A) NEGATIVE Final    INVALID, UNABLE TO DETERMINE THE PRESENCE OF TARGET DNA DUE TO SPECIMEN INTEGRITY. RECOLLECTION REQUESTED.    Comment: (NOTE) Interpretive Result Comment(s): COVID 19 Positive SARS CoV 2 target nucleic acids are DETECTED. The SARS CoV 2 RNA is generally detectable in upper and lower respiratory specimens during the acute phase of infection.  Positive results are indicative of active infection with SARS CoV 2.  Clinical correlation with patient history and other diagnostic information is necessary to determine patient infection status.  Positive results do not rule out bacterial infection or coinfection with other viruses. The expected result is Negative. COVID 19 Negative SARS CoV 2 target nucleic acids are NOT DETECTED. The SARS CoV 2 RNA is generally detectable in upper and lower respiratory specimens during the acute phase of infection.  Negative results do not preclude SARS CoV 2 infection, do not rule out coinfections with other pathogens, and should not be used as the sole basis for treatment or other patient management decisions.  Negative results must be combined with clinical  observations, patient history, and epidemiological information. The expected result is Negative. Invalid Presence or absence of SARS CoV 2 nucleic acids cannot be determined. Repeat testing was performed on the submitted specimen and repeated Invalid results were obtained.  If clinically indicated, additional testing on a new specimen with an alternate test methodology [phone]) is advised.  The SARS CoV 2 RNA is generally detectable in upper and lower respiratory specimens during the acute phase of infection. The expected result is Negative. Fact Sheet for Patients:   http://www.graves-ford.org/ Fact Sheet for Healthcare Providers: EnviroConcern.si This test is not yet approved or cleared by the [place] FDA and has been authorized for detection and/or diagnosis of SARS CoV 2 by FDA under an Emergency Use Authorization (EUA).  This EUA will remain in effect (meaning this test can be used) for the duration of the COVID19 d eclaration under Section 564(b)(1) of the Act, 21 U.S.C. section 8046066116 3(b)(1), unless the authorization is terminated or revoked sooner. Performed at [facility], [address] [place], [place] [address]   SARS Coronavirus 2 (Hosp order,Performed in [facility] lab via [employer]  ID)     Status: None   Collection Time: 08/02/18  1:41 PM  Result Value Ref Range Status   SARS Coronavirus 2 ([employer] ID Now) NEGATIVE NEGATIVE Final    Comment: (NOTE) Interpretive Result Comment(s): COVID 19 Positive SARS CoV 2 target nucleic acids are DETECTED. The SARS CoV 2 RNA is generally detectable in upper and lower respiratory specimens during the acute phase of infection.  Positive results are indicative of active infection with SARS CoV 2.  Clinical correlation with patient history and other diagnostic information is necessary to determine patient infection status.  Positive results do not rule out bacterial infection or coinfection with other viruses. The expected result is Negative. COVID 19 Negative SARS CoV 2 target nucleic acids are NOT DETECTED. The SARS CoV 2 RNA is generally detectable in upper and lower respiratory specimens during the acute phase of infection.  Negative results do not preclude SARS CoV 2 infection, do not rule out coinfections with other pathogens, and should not be used as the sole basis for treatment or other patient management decisions.  Negative results must be combined with clinical  observations, patient history, and epidemiological  information. The expected result is Negative. Invalid Presence or absence of SARS CoV 2 nucleic acids cannot be determined. Repeat testing was performed on the submitted specimen and repeated Invalid results were obtained.  If clinically indicated, additional testing on a new specimen with an alternate test methodology [phone]) is advised.  The SARS CoV 2 RNA is generally detectable in upper and lower respiratory specimens during the acute phase of infection. The expected result is Negative. Fact Sheet for Patients:  http://www.graves-ford.org/ Fact Sheet for Healthcare Providers: EnviroConcern.si This test is not yet approved or cleared by the [place] FDA and has been authorized for detection and/or diagnosis of SARS CoV 2 by FDA under an Emergency Use Authorization (EUA).  This EUA will remain in effect (meaning this test can be used) for the duration of the COVID19 d eclaration under Section 564(b)(1) of the Act, 21 U.S.C. section [phone] 3(b)(1), unless the authorization is terminated or revoked sooner. Performed at [facility], [address] [place], [place] [address]   MRSA PCR Screening     Status: None   Collection Time: 08/02/18  5:20 PM  Result Value Ref Range Status   MRSA by PCR NEGATIVE NEGATIVE Final    Comment:        The GeneXpert MRSA Assay (FDA approved for NASAL specimens only), is one component of a comprehensive MRSA colonization surveillance program. It is not intended to diagnose MRSA infection nor to guide or monitor treatment for MRSA infections. Performed at [facility], [address], [place], [place] [address]      Labs: BNP (last 3 results) No results for input(s): BNP in the last 8760 hours. Basic Metabolic Panel: Recent Labs  Lab 08/02/18 1824  08/03/18 0536 08/03/18 0917 08/03/18 1909 08/04/18 0352 08/05/18 0323  NA 138   < > 137 137 133* 136 136  K  3.7   < > 3.4* 3.6 4.6 3.8 3.5  CL 111   < > 112* 111 109 112* 108  CO2 17*   < > 16* 17* 16* 17* 21*  GLUCOSE 184*   < > 199* 155* 337* 253* 237*  BUN 24*   < > 20 20 21* 16 13  CREATININE 1.45*   < > 1.11 1.14 1.09 0.98 0.91  CALCIUM 8.5*   < >  8.3* 8.4* 8.3* 8.3* 8.8*  MG 2.3  --   --   --   --   --   --   PHOS 2.0*  --   --   --   --   --   --    < > = values in this interval not displayed.   Liver Function Tests: Recent Labs  Lab 08/02/18 1054 08/04/18 0352 08/05/18 0323  AST 57* 62* 66*  ALT 65* 70* 85*  ALKPHOS 90 66 70  BILITOT 1.5* 0.8 0.6  PROT 8.2* 5.9* 6.3*  ALBUMIN 4.5 3.2* 3.5   Recent Labs  Lab 08/02/18 1054  LIPASE 50   No results for input(s): AMMONIA in the last 168 hours. CBC: Recent Labs  Lab 08/02/18 1054 08/03/18 0536 08/04/18 0352 08/05/18 0323  WBC 8.7 6.9 5.4 5.5  HGB 14.3 12.2* 11.7* 13.0  HCT 45.2 38.7* 38.7* 41.1  MCV 85.0 85.1 86.0 84.0  PLT 207 148* 142* 134*   Cardiac Enzymes: Recent Labs  Lab 08/02/18 1824 08/03/18 0039 08/03/18 0536  TROPONINI <0.03 <0.03 <0.03   BNP: Invalid input(s): POCBNP CBG: Recent Labs  Lab 08/04/18 1227 08/04/18 1623 08/04/18 2133 08/05/18 0803 08/05/18 1232  GLUCAP 292* 319* 268* 255* 368*   D-Dimer No results for input(s): DDIMER in the last 72 hours. Hgb A1c Recent Labs    08/02/18 1824  HGBA1C 13.7*   Lipid Profile No results for input(s): CHOL, HDL, LDLCALC, TRIG, CHOLHDL, LDLDIRECT in the last 72 hours. Thyroid function studies Recent Labs    08/02/18 1824  TSH 1.929   Anemia work up No results for input(s): VITAMINB12, FOLATE, FERRITIN, TIBC, IRON, RETICCTPCT in the last 72 hours. Urinalysis    Component Value Date/Time   COLORURINE YELLOW 08/02/2018 1713   APPEARANCEUR CLEAR 08/02/2018 1713   LABSPEC 1.016 08/02/2018 1713   PHURINE 5.0 08/02/2018 1713   GLUCOSEU >=500 (A) 08/02/2018 1713   HGBUR NEGATIVE 08/02/2018 1713   BILIRUBINUR NEGATIVE 08/02/2018 1713    KETONESUR 80 (A) 08/02/2018 1713   PROTEINUR 30 (A) 08/02/2018 1713   UROBILINOGEN 0.2 05/17/2008 1344   NITRITE NEGATIVE 08/02/2018 1713   LEUKOCYTESUR NEGATIVE 08/02/2018 1713   Sepsis Labs Invalid input(s): PROCALCITONIN,  WBC,  LACTICIDVEN Microbiology Recent Results (from the past 240 hour(s))  SARS Coronavirus 2 (Hosp order,Performed in [facility] lab via [employer] ID)     Status: Abnormal   Collection Time: 08/02/18  1:14 PM  Result Value Ref Range Status   SARS Coronavirus 2 ([employer] ID Now) (A) NEGATIVE Final    INVALID, UNABLE TO DETERMINE THE PRESENCE OF TARGET DNA DUE TO SPECIMEN INTEGRITY. RECOLLECTION REQUESTED.    Comment: (NOTE) Interpretive Result Comment(s): COVID 19 Positive SARS CoV 2 target nucleic acids are DETECTED. The SARS CoV 2 RNA is generally detectable in upper and lower respiratory specimens during the acute phase of infection.  Positive results are indicative of active infection with SARS CoV 2.  Clinical correlation with patient history and other diagnostic information is necessary to determine patient infection status.  Positive results do not rule out bacterial infection or coinfection with other viruses. The expected result is Negative. COVID 19 Negative SARS CoV 2 target nucleic acids are NOT DETECTED. The SARS CoV 2 RNA is generally detectable in upper and lower respiratory specimens during the acute phase of infection.  Negative results do not preclude SARS CoV 2 infection, do not rule out coinfections with other pathogens, and should not be used as  the sole basis for treatment or other patient management decisions.  Negative results must be combined with clinical  observations, patient history, and epidemiological information. The expected result is Negative. Invalid Presence or absence of SARS CoV 2 nucleic acids cannot be determined. Repeat testing was performed on the submitted specimen and repeated Invalid results were obtained.  If  clinically indicated, additional testing on a new specimen with an alternate test methodology [phone]) is advised.  The SARS CoV 2 RNA is generally detectable in upper and lower respiratory specimens during the acute phase of infection. The expected result is Negative. Fact Sheet for Patients:  http://www.graves-ford.org/ Fact Sheet for Healthcare Providers: EnviroConcern.si This test is not yet approved or cleared by the [place] FDA and has been authorized for detection and/or diagnosis of SARS CoV 2 by FDA under an Emergency Use Authorization (EUA).  This EUA will remain in effect (meaning this test can be used) for the duration of the COVID19 d eclaration under Section 564(b)(1) of the Act, 21 U.S.C. section [phone] 3(b)(1), unless the authorization is terminated or revoked sooner. Performed at [facility], [address] [place], [place] [address]   SARS Coronavirus 2 (Hosp order,Performed in [facility] lab via [employer] ID)     Status: None   Collection Time: 08/02/18  1:41 PM  Result Value Ref Range Status   SARS Coronavirus 2 ([employer] ID Now) NEGATIVE NEGATIVE Final    Comment: (NOTE) Interpretive Result Comment(s): COVID 19 Positive SARS CoV 2 target nucleic acids are DETECTED. The SARS CoV 2 RNA is generally detectable in upper and lower respiratory specimens during the acute phase of infection.  Positive results are indicative of active infection with SARS CoV 2.  Clinical correlation with patient history and other diagnostic information is necessary to determine patient infection status.  Positive results do not rule out bacterial infection or coinfection with other viruses. The expected result is Negative. COVID 19 Negative SARS CoV 2 target nucleic acids are NOT DETECTED. The SARS CoV 2 RNA is generally detectable in upper and lower respiratory specimens during the acute phase of infection.  Negative results do  not preclude SARS CoV 2 infection, do not rule out coinfections with other pathogens, and should not be used as the sole basis for treatment or other patient management decisions.  Negative results must be combined with clinical  observations, patient history, and epidemiological information. The expected result is Negative. Invalid Presence or absence of SARS CoV 2 nucleic acids cannot be determined. Repeat testing was performed on the submitted specimen and repeated Invalid results were obtained.  If clinically indicated, additional testing on a new specimen with an alternate test methodology [phone]) is advised.  The SARS CoV 2 RNA is generally detectable in upper and lower respiratory specimens during the acute phase of infection. The expected result is Negative. Fact Sheet for Patients:  http://www.graves-ford.org/ Fact Sheet for Healthcare Providers: EnviroConcern.si This test is not yet approved or cleared by the [place] FDA and has been authorized for detection and/or diagnosis of SARS CoV 2 by FDA under an Emergency Use Authorization (EUA).  This EUA will remain in effect (meaning this test can be used) for the duration of the COVID19 d eclaration under Section 564(b)(1) of the Act, 21 U.S.C. section [phone] 3(b)(1), unless the authorization is terminated or revoked sooner. Performed at [facility], [address] [place], [place] [address]   MRSA PCR Screening     Status:  None   Collection Time: 08/02/18  5:20 PM  Result Value Ref Range Status   MRSA by PCR NEGATIVE NEGATIVE Final    Comment:        The GeneXpert MRSA Assay (FDA approved for NASAL specimens only), is one component of a comprehensive MRSA colonization surveillance program. It is not intended to diagnose MRSA infection nor to guide or monitor treatment for MRSA infections. Performed at [facility], [address],  [place], [place] [address]      Time coordinating discharge: 30 minutes  SIGNED:   Hughie Closs, MD  [facility] 08/05/2018, 4:20 PM Pager [phone]  If 7PM-7AM, please contact night-coverage [email or web address] Password [email or web address]

## 2018-08-06 ENCOUNTER — Encounter: Payer: Self-pay | Admitting: Physical Medicine and Rehabilitation

## 2018-08-07 ENCOUNTER — Telehealth: Payer: Self-pay | Admitting: Physical Medicine and Rehabilitation

## 2018-08-07 ENCOUNTER — Ambulatory Visit (INDEPENDENT_AMBULATORY_CARE_PROVIDER_SITE_OTHER): Payer: BLUE CROSS/BLUE SHIELD | Admitting: Medical

## 2018-08-07 ENCOUNTER — Encounter: Payer: Self-pay | Admitting: Medical

## 2018-08-07 DIAGNOSIS — F1010 Alcohol abuse, uncomplicated: Secondary | ICD-10-CM | POA: Diagnosis not present

## 2018-08-07 DIAGNOSIS — E1169 Type 2 diabetes mellitus with other specified complication: Secondary | ICD-10-CM

## 2018-08-07 DIAGNOSIS — I4891 Unspecified atrial fibrillation: Secondary | ICD-10-CM

## 2018-08-07 DIAGNOSIS — R748 Abnormal levels of other serum enzymes: Secondary | ICD-10-CM

## 2018-08-07 DIAGNOSIS — I10 Essential (primary) hypertension: Secondary | ICD-10-CM | POA: Diagnosis not present

## 2018-08-07 NOTE — Telephone Encounter (Signed)
We have completed 2 epidural injections 1 in August and 1 in September.  Those would not have caused his blood sugar to increase any more than a few days to a week or so at a time if the patient already had diabetes.  In a patient without diabetes the insulin level goes up accordingly and the blood sugar comes back down accordingly and there is no long-term issue.  If he is having diabetic problems with prolonged blood sugars that is or not from the injections performed in August and September.  If he were continually getting injections of steroid over and over say every 2 to 3 weeks on an ongoing basis now that would maximize blood sugar be sustained.  He needs to talk to his family doctor who ever is managing his blood sugar and get a good idea of the role of the injections and that.

## 2018-08-07 NOTE — Patient Instructions (Addendum)
For htn continue on current med regimen. Please get bp monitor otc the counter to confirm bp is well controlled.  For hx of alcohol abuse please stop completely. Counseled on the benefit to stop.  For atrial fibrillation., continue eliquis and metoprolol.   For diabetes continue current meds, low sugar diet and placed diabetic education referral. Also explained process on how to get glucometer free through insurance, rx if you get name of preferred brand or relion otc montior if other options fail.   Cbc, cmp, and lipid panel one week.  Follow up with cardiologist.  For back pain will send note to ortho PA inform of recent diabetes diagnosis a1c of 13.7. Recommend hold of on epidural.  Follow up with our office to be determined after review of blood work/labs.

## 2018-08-07 NOTE — Progress Notes (Signed)
Virtual Visit via Video Note  I connected with [name] on 08/07/18 at  1:20 PM EDT by a video enabled telemedicine application and verified that I am speaking with the correct person using two identifiers.  Location: Patient: home Provider: home   I discussed the limitations of evaluation and management by telemedicine and the availability of in person appointments. The patient expressed understanding and agreed to proceed.   History of Present Illness: Pt has visit today for hospital follow up.  Reviewed pt dc summary today attached below.    Discharge Condition: Stable CODE STATUS: Full code Diet recommendation: Heart healthy  Subjective: Seen and examined.  No complaints.  Denies any chest pain or palpitation or shortness of breath.  Brief/Interim Summary: [name] a 52 y.o.malewith medical history significant ofnewly diagnosed diabetes mellitus and hypertension [facility] with nausea ,vomiting, abdominal pain for 2 days.he was found to be in hyperglycemic state, hypotensive, acute renal insufficiency, elevated LFTs,and diabetic ketoacidotic state with blood sugars of 482, 508, anion gap of21,creatinine of 2.03  He reported that he has had a recent back injury with a bulging disc, therefore receiving steroid shots to his back, and finished oral steroid taper. He was started on DKA protocol and he was subsequently transferred under hospitalist service at [facility] however upon arrival to ICU and upon arrival to the ICU, he was found to be in atrial flutter and atrial fibrillation. Patient does not carry any history of atrial fibrillation. Slightly hypotensive. He was given some Cardizem. Cardiology was consulted. He was started on IV anticoagulation and oral beta-blockers by cardiology and later on was switched to oral Eliquis.  His DKA was treated per protocol with insulin drip and IV fluids and subsequently he  was started on long-acting insulin with sliding scale insulin and his DKA had resolved within 24 hours and his blood sugar has remained fairly controlled for last 2 days.  His heart rate was initially higher on low-dose of beta-blocker metoprolol which was then increased to 50 mg twice daily and his heart rates have remained controlled since then and patient has remained completely asymptomatic since past 2 days.  He had pause x2 each of 2.3 seconds.  He was seen by cardiology and they have cleared him to be discharged.  I also personally discussed the case with Dr. Eli Hose from cardiology who also cleared the patient to be discharged home.  They will see him as an outpatient.  He is going to be discharged on Eliquis.  Of note, for his diabetes, he was taking metformin 500 mg twice daily and I have changed that to Janumet 50/500 twice daily and have added glipizide 5 mg p.o. twice daily.    Discharge Diagnoses:  Principal Problem:   DKA (diabetic ketoacidoses) (HCC) Active Problems:   HTN (hypertension)   Hypotension   AKI (acute kidney injury) (HCC)   Elevated LFTs   New onset a-fib (HCC)  Pt is feeling better now. He understand that steroid injections for his back pain played role in his very high blood sugars. Pt in past had been on meloxicam and ibuprofen. Pt was seeing orthopedist for back pain. He has bee on lidoderm, robaxin and tramadol. Presently back feels fine.  For diabetes he is taking janumet and glipizide. He is able tolerate these meds.  He states no obvious adverse side effects. Pt does not have a glucometer.      ROS.  General- no fatigue(energy  improved), no fever, no chills, or sweats. heent- negative. No sinus pressure. No st. Eyes- no visual disturbance. Lungs- no othropnea. No cough. No sob. Heart- no palpitations. No chest pain.  Abdomen- no abd pain. No blood in stool. No nauseu, no vomting. Endocrine- no poly(urina, dypnsea or phagia. Urinary-neg. Neuro- no  ha. No dizziness. No deficits. Mood- no depression or anxiety. Lymphatic- no adenopathy.  msk- no joint pain. No pedal edema.    Observations/Objective: General- no acute distress. Pleasant. Oriented. Lungs- unlabored breathing on inspection. onl lying supine. No sob. Neuro- gross motor functio intact. Lower ext- no pedal edema. Neck- no jvd. Abdomen-not protuberant.   Assessment and Plan: For htn continue on current med regimen. Please get bp monitor otc the counter to confirm bp is well controlled.  For hx of alcohol abuse please stop completely. Counseled on the benefit to stop.  For atrial fibrillation., continue eliquis and metoprolol.   For diabetes continue current meds, low sugar diet and placed diabetic education referral. Also explained process on how to get glucometer free through insurance, rx if you get name of preferred brand or relion otc montior if other options fail.   Cbc, cmp, and lipid panel one week.  For back pain will send note to ortho PA inform of recent diabetes diagnosis a1c of 13.7. Recommend hold of on epidural.  Follow up with cardiologist.  Follow up with our office to be determined after review of blood work/labs.  Follow Up Instructions:    I discussed the assessment and treatment plan with the patient. The patient was provided an opportunity to ask questions and all were answered. The patient agreed with the plan and demonstrated an understanding of the instructions.   The patient was advised to call back or seek an in-person evaluation if the symptoms worsen or if the condition fails to improve as anticipated.     Esperanza RichtersEdward Tyden Kann, PA-C

## 2018-08-08 NOTE — Telephone Encounter (Signed)
Called the patient to advise and see if he wants to reschedule. He states that the physician who manages his diabetes told him he cannot have any more steroid injections right now and that he would get in touch with Dr. Victory Dakin to figure out the next step.

## 2018-08-13 ENCOUNTER — Telehealth: Payer: Self-pay | Admitting: Medical

## 2018-08-13 ENCOUNTER — Telehealth: Payer: Self-pay | Admitting: Orthopaedic Surgery

## 2018-08-13 DIAGNOSIS — E1169 Type 2 diabetes mellitus with other specified complication: Secondary | ICD-10-CM

## 2018-08-13 NOTE — Telephone Encounter (Signed)
Misunderstood when he could not give numbers. So virtual visit. Is fine if numbers are coming down. Will discuss on virtual visit.

## 2018-08-13 NOTE — Telephone Encounter (Signed)
Please advise 

## 2018-08-13 NOTE — Telephone Encounter (Signed)
I referred for diabetic eye check. He is new pt to me. Very high a1c 13.7. Did he get glucometer? Has he checked his sugars. With blurred vision need to know what his sugars are?   Will you schedule him virtual visit with me tomorrow, Thursday or Friday.  I had asked him to call his insurance company to see if they would give him free gucometer, or get name of preferred brand. Or get relion over the counter glucometer. Which did he do?

## 2018-08-13 NOTE — Telephone Encounter (Signed)
That is a concern since. That likely indicates numbers too high to read. If he feels dizzy, light headed, or fatigue then best for him to go to ED.  In fact if you can advise him it is possible after talking with him tomorrow may advise ED evaluation

## 2018-08-13 NOTE — Telephone Encounter (Signed)
Spoke w/ Pt- he was able to get a glucometer- states he has been checking but wasn't able to give any numbers. Virtual visit scheduled for tomorrow afternoon.

## 2018-08-13 NOTE — Telephone Encounter (Signed)
Pt called to let Dr Magnus Ivan and Bronson Curb know that he was just diagnosed with Diabetes and High Blood Pressure.  He also stated his A1C was 13.7 and he was admitted on the 16th of may and was released the 18th.  Pt would like to know what he should do next ?

## 2018-08-13 NOTE — Telephone Encounter (Signed)
Copied from CRM [id number]. Topic: General - Other >> Aug 13, 2018 10:54 AM Doreatha Massed wrote: Reason for CRM: pt stated he is having blurred vision due to his diabetics and want to know if he need to see an eye doctor.

## 2018-08-13 NOTE — Telephone Encounter (Signed)
Please advise Looks like when you saw him last for his back you had recommended injections with [facility] vs. Seeing a spinal surgeon

## 2018-08-13 NOTE — Telephone Encounter (Signed)
No Pt states his numbers are improving but hasn't written them down to provide them.

## 2018-08-14 ENCOUNTER — Other Ambulatory Visit: Payer: Self-pay

## 2018-08-14 ENCOUNTER — Ambulatory Visit (INDEPENDENT_AMBULATORY_CARE_PROVIDER_SITE_OTHER): Payer: BLUE CROSS/BLUE SHIELD | Admitting: Medical

## 2018-08-14 ENCOUNTER — Encounter: Payer: Self-pay | Admitting: Medical

## 2018-08-14 VITALS — BP 132/84 | HR 52

## 2018-08-14 DIAGNOSIS — E1169 Type 2 diabetes mellitus with other specified complication: Secondary | ICD-10-CM | POA: Diagnosis not present

## 2018-08-14 DIAGNOSIS — M5441 Lumbago with sciatica, right side: Secondary | ICD-10-CM

## 2018-08-14 DIAGNOSIS — I4891 Unspecified atrial fibrillation: Secondary | ICD-10-CM

## 2018-08-14 DIAGNOSIS — I10 Essential (primary) hypertension: Secondary | ICD-10-CM

## 2018-08-14 DIAGNOSIS — H538 Other visual disturbances: Secondary | ICD-10-CM | POA: Diagnosis not present

## 2018-08-14 DIAGNOSIS — G8929 Other chronic pain: Secondary | ICD-10-CM

## 2018-08-14 NOTE — Patient Instructions (Addendum)
Your sugars have been well controlled recently. Discussed treatment of hypoglycemia if her were to get. Also explained if were to get varius events the may dc gluctotrol. Currently no low sugar events. See diabetic educator. Continue current diabetic meds.  For htn and atrial fibrillation continue eliquis and metoprolol.  For blurred vision call eye MD back today and see when they can get you scheduled. Pt will call when we get off phone.  For back pain with hx of disc herniation ortho referred to neurosurgeon since epidural not option recently.  Get labs done this week.  Follow up to be determined after lab review.

## 2018-08-14 NOTE — Telephone Encounter (Signed)
Due to his recent diagnosis of diabetes and is very high A1c recommend he was referred to neurosurgery and no additional injections with Dr. Alvester Morin.
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
## 2018-08-14 NOTE — Telephone Encounter (Signed)
Patient aware this will be scheduled
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
## 2018-08-19 ENCOUNTER — Ambulatory Visit: Payer: BLUE CROSS/BLUE SHIELD | Admitting: Cardiology

## 2018-08-19 DIAGNOSIS — E119 Type 2 diabetes mellitus without complications: Secondary | ICD-10-CM | POA: Insufficient documentation
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
## 2018-08-20 ENCOUNTER — Telehealth: Payer: Self-pay | Admitting: Medical

## 2018-08-20 ENCOUNTER — Ambulatory Visit (INDEPENDENT_AMBULATORY_CARE_PROVIDER_SITE_OTHER): Payer: BLUE CROSS/BLUE SHIELD | Admitting: Cardiology

## 2018-08-20 ENCOUNTER — Other Ambulatory Visit: Payer: Self-pay

## 2018-08-20 ENCOUNTER — Encounter: Payer: Self-pay | Admitting: Cardiology

## 2018-08-20 ENCOUNTER — Other Ambulatory Visit (INDEPENDENT_AMBULATORY_CARE_PROVIDER_SITE_OTHER): Payer: BLUE CROSS/BLUE SHIELD

## 2018-08-20 VITALS — BP 130/88 | HR 54 | Ht 74.5 in | Wt 260.1 lb

## 2018-08-20 DIAGNOSIS — F1010 Alcohol abuse, uncomplicated: Secondary | ICD-10-CM | POA: Diagnosis not present

## 2018-08-20 DIAGNOSIS — E119 Type 2 diabetes mellitus without complications: Secondary | ICD-10-CM

## 2018-08-20 DIAGNOSIS — I4891 Unspecified atrial fibrillation: Secondary | ICD-10-CM | POA: Diagnosis not present

## 2018-08-20 DIAGNOSIS — I10 Essential (primary) hypertension: Secondary | ICD-10-CM

## 2018-08-20 DIAGNOSIS — I159 Secondary hypertension, unspecified: Secondary | ICD-10-CM | POA: Diagnosis not present

## 2018-08-20 LAB — CBC WITH DIFFERENTIAL/PLATELET
Basophils Absolute: 0 10*3/uL (ref 0.0–0.1)
Basophils Relative: 0.3 % (ref 0.0–3.0)
Eosinophils Absolute: 0.1 10*3/uL (ref 0.0–0.7)
Eosinophils Relative: 1.8 % (ref 0.0–5.0)
HCT: 37.4 % — ABNORMAL LOW (ref 39.0–52.0)
Hemoglobin: 12.2 g/dL — ABNORMAL LOW (ref 13.0–17.0)
Lymphocytes Relative: 34.4 % (ref 12.0–46.0)
Lymphs Abs: 2.4 10*3/uL (ref 0.7–4.0)
MCHC: 32.7 g/dL (ref 30.0–36.0)
MCV: 83.8 fl (ref 78.0–100.0)
Monocytes Absolute: 0.4 10*3/uL (ref 0.1–1.0)
Monocytes Relative: 5.5 % (ref 3.0–12.0)
Neutro Abs: 4.1 10*3/uL (ref 1.4–7.7)
Neutrophils Relative %: 58 % (ref 43.0–77.0)
Platelets: 217 10*3/uL (ref 150.0–400.0)
RBC: 4.46 Mil/uL (ref 4.22–5.81)
RDW: 14.1 % (ref 11.5–15.5)
WBC: 7.1 10*3/uL (ref 4.0–10.5)

## 2018-08-20 LAB — COMPREHENSIVE METABOLIC PANEL
ALT: 61 U/L — ABNORMAL HIGH (ref 0–53)
AST: 36 U/L (ref 0–37)
Albumin: 4.4 g/dL (ref 3.5–5.2)
Alkaline Phosphatase: 76 U/L (ref 39–117)
BUN: 11 mg/dL (ref 6–23)
CO2: 29 mEq/L (ref 19–32)
Calcium: 9.7 mg/dL (ref 8.4–10.5)
Chloride: 102 mEq/L (ref 96–112)
Creatinine, Ser: 1.14 mg/dL (ref 0.40–1.50)
GFR: 82.07 mL/min (ref >60.00)
Glucose, Bld: 77 mg/dL (ref 70–99)
Potassium: 4 mEq/L (ref 3.5–5.1)
Sodium: 140 mEq/L (ref 135–145)
Total Bilirubin: 0.7 mg/dL (ref 0.2–1.2)
Total Protein: 7 g/dL (ref 6.0–8.3)

## 2018-08-20 LAB — LIPID PANEL
Cholesterol: 159 mg/dL (ref 0–200)
HDL: 33.4 mg/dL — ABNORMAL LOW (ref >39.00)
LDL Cholesterol: 104 mg/dL — ABNORMAL HIGH (ref 0–99)
NonHDL: 125.71
Total CHOL/HDL Ratio: 5
Triglycerides: 109 mg/dL (ref 0.0–149.0)
VLDL: 21.8 mg/dL (ref 0.0–40.0)

## 2018-08-20 MED ORDER — ATORVASTATIN CALCIUM 10 MG PO TABS: 10.0000 mg | ORAL_TABLET | Freq: Every day | ORAL | 3 refills | Status: DC

## 2018-08-20 MED ORDER — METOPROLOL TARTRATE 50 MG PO TABS: 50.0000 mg | ORAL_TABLET | Freq: Two times a day (BID) | ORAL | 3 refills | Status: DC

## 2018-08-20 MED ORDER — APIXABAN 5 MG PO TABS: 5.0000 mg | ORAL_TABLET | Freq: Two times a day (BID) | ORAL | 5 refills | Status: DC

## 2018-08-20 NOTE — Telephone Encounter (Signed)
Age 53, weight 118kg, SCr 0.91 on 08/05/18

## 2018-08-20 NOTE — Patient Instructions (Signed)
Medication Instructions:  Your physician recommends that you continue on your current medications as directed. Please refer to the Current Medication list given to you today.  If you need a refill on your cardiac medications before your next appointment, please call your pharmacy.   Lab work: None  If you have labs (blood work) drawn today and your tests are completely normal, you will receive your results only by: Marland Kitchen MyChart Message (if you have MyChart) OR . A paper copy in the mail If you have any lab test that is abnormal or we need to change your treatment, we will call you to review the results.  Testing/Procedures:   Follow-Up: At [facility], you and your health needs are our priority.  As part of our continuing mission to provide you with exceptional heart care, we have created designated Provider Care Teams.  These Care Teams include your primary Cardiologist (physician) and Advanced Practice Providers (APPs -  Physician Assistants and Nurse Practitioners) who all work together to provide you with the care you need, when you need it. You will need a follow up appointment in 3-4 months.  Please call our office 2 months in advance to schedule this appointment.  You may see Lance Muss, MD or one of the following Advanced Practice Providers on your designated Care Team:   [place], PA-C Ronie Spies, PA-C . Jacolyn Reedy, PA-C  Any Other Special Instructions Will Be Listed Below (If Applicable).

## 2018-08-20 NOTE — Telephone Encounter (Signed)
Rx atorvastatin sent to pt pharmacy. 

## 2018-08-22 ENCOUNTER — Telehealth: Payer: Self-pay | Admitting: Orthopaedic Surgery

## 2018-08-22 ENCOUNTER — Telehealth: Payer: Self-pay | Admitting: Medical

## 2018-08-22 NOTE — Telephone Encounter (Signed)
Patient called stating his diabetes doctor had him cancel his appointment with Dr. Alvester Morin due to his A1C being high.   Patient is requesting a return call to discuss treatment options.

## 2018-08-22 NOTE — Telephone Encounter (Signed)
Patient scheduled with [name]

## 2018-08-22 NOTE — Telephone Encounter (Signed)
Signed medical supply sheet for bp cuff. Gave to staff to fax.

## 2018-08-22 NOTE — Telephone Encounter (Signed)
See below

## 2018-08-22 NOTE — Telephone Encounter (Signed)
No other recommendations unfortunately given his high blood glucose.  It has been a year since his lumbar spine MRI.  We could always repeat it to see if things have worsened.  Ask him if he would like to be referred to physical therapy for his back.  We could also have Dr. Otelia Sergeant or Ophelia Charter see him for a surgical evaluation.

## 2018-08-27 ENCOUNTER — Telehealth: Payer: Self-pay

## 2018-08-27 NOTE — Telephone Encounter (Signed)
wc adj needs clarification on neurosurgeon referral. Does he still need this or does this need to be cancelled since he is scheduled with Dr.Yates now? Please advise and I will email her back to advise.

## 2018-08-28 NOTE — Telephone Encounter (Signed)
Would hold off since seeing Yates.

## 2018-08-28 NOTE — Telephone Encounter (Signed)
Emailed Anderson Malta back to advise and to see if the appt with Dr. Lorin Mercy is approved

## 2018-08-28 NOTE — Telephone Encounter (Signed)
Hold off with that referral he said, he will just see yates here

## 2018-08-30 NOTE — Telephone Encounter (Signed)
appt with dr. Lorin Mercy is approved per email from [facility] @ [address]

## 2018-09-04 ENCOUNTER — Ambulatory Visit (INDEPENDENT_AMBULATORY_CARE_PROVIDER_SITE_OTHER): Payer: No Typology Code available for payment source

## 2018-09-04 ENCOUNTER — Encounter: Payer: Self-pay | Admitting: Orthopaedic Surgery

## 2018-09-04 ENCOUNTER — Other Ambulatory Visit: Payer: Self-pay

## 2018-09-04 ENCOUNTER — Ambulatory Visit (INDEPENDENT_AMBULATORY_CARE_PROVIDER_SITE_OTHER): Payer: No Typology Code available for payment source | Admitting: Orthopaedic Surgery

## 2018-09-04 VITALS — Ht 74.5 in | Wt 260.0 lb

## 2018-09-04 DIAGNOSIS — M48061 Spinal stenosis, lumbar region without neurogenic claudication: Secondary | ICD-10-CM | POA: Insufficient documentation

## 2018-09-04 DIAGNOSIS — M5442 Lumbago with sciatica, left side: Secondary | ICD-10-CM

## 2018-09-04 DIAGNOSIS — M5126 Other intervertebral disc displacement, lumbar region: Secondary | ICD-10-CM | POA: Diagnosis not present

## 2018-09-04 DIAGNOSIS — M5136 Other intervertebral disc degeneration, lumbar region: Secondary | ICD-10-CM | POA: Insufficient documentation

## 2018-09-04 DIAGNOSIS — M5441 Lumbago with sciatica, right side: Secondary | ICD-10-CM

## 2018-09-04 DIAGNOSIS — G8929 Other chronic pain: Secondary | ICD-10-CM

## 2018-09-04 DIAGNOSIS — M48062 Spinal stenosis, lumbar region with neurogenic claudication: Secondary | ICD-10-CM
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
## 2018-09-05 ENCOUNTER — Other Ambulatory Visit: Payer: Self-pay

## 2018-09-05 ENCOUNTER — Telehealth: Payer: Self-pay | Admitting: Medical

## 2018-09-05 NOTE — Telephone Encounter (Signed)
Glucometer form filled out for insurance. Will give to you. Please fax to [place].

## 2018-09-06 NOTE — Telephone Encounter (Signed)
Forms faxed today 09/06/18 

## 2018-09-09 ENCOUNTER — Telehealth: Payer: Self-pay | Admitting: Orthopaedic Surgery

## 2018-09-09 NOTE — Telephone Encounter (Signed)
Last 3 ov notes faxed to [place] [facility] [phone]

## 2018-09-12 DIAGNOSIS — I10 Essential (primary) hypertension: Secondary | ICD-10-CM | POA: Diagnosis not present

## 2018-09-12 DIAGNOSIS — E119 Type 2 diabetes mellitus without complications: Secondary | ICD-10-CM | POA: Diagnosis not present

## 2018-09-19 ENCOUNTER — Ambulatory Visit: Payer: BLUE CROSS/BLUE SHIELD | Admitting: *Deleted

## 2018-09-29 DIAGNOSIS — E119 Type 2 diabetes mellitus without complications: Secondary | ICD-10-CM | POA: Diagnosis not present

## 2018-09-29 DIAGNOSIS — I10 Essential (primary) hypertension: Secondary | ICD-10-CM | POA: Diagnosis not present

## 2018-10-08 ENCOUNTER — Encounter: Payer: Self-pay | Admitting: Orthopaedic Surgery

## 2018-10-08 ENCOUNTER — Ambulatory Visit (INDEPENDENT_AMBULATORY_CARE_PROVIDER_SITE_OTHER): Payer: No Typology Code available for payment source | Admitting: Orthopaedic Surgery

## 2018-10-08 ENCOUNTER — Telehealth: Payer: Self-pay | Admitting: Radiology

## 2018-10-08 VITALS — Ht 74.0 in | Wt 255.0 lb

## 2018-10-08 DIAGNOSIS — M48062 Spinal stenosis, lumbar region with neurogenic claudication: Secondary | ICD-10-CM | POA: Diagnosis not present

## 2018-10-08 DIAGNOSIS — M5126 Other intervertebral disc displacement, lumbar region: Secondary | ICD-10-CM

## 2018-10-08 DIAGNOSIS — M5136 Other intervertebral disc degeneration, lumbar region: Secondary | ICD-10-CM

## 2018-10-08 NOTE — Telephone Encounter (Signed)
Patient was seen by Dr Lorin Mercy today. He has taken him out of work pending scheduling of L4-5 decompression, microdiscectomy.  He is W/C.  Wanted to make you aware.
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
## 2018-10-15 NOTE — Telephone Encounter (Signed)
Emailed request and last office note to wc adj Tobias Alexander

## 2018-10-23 NOTE — Telephone Encounter (Signed)
Received vm from wc adj Tobias Alexander stating she has received the surgery request and is reviewing. Asked for the 02/11/18 office note stating she only had the partial note. Emailed it to her @ [email or web address].
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
## 2018-11-12 NOTE — Telephone Encounter (Signed)
Emailed Anderson Malta Sarhaddi to see if there was any update

## 2018-11-13 ENCOUNTER — Telehealth: Payer: Self-pay | Admitting: Orthopaedic Surgery

## 2018-11-13 DIAGNOSIS — E119 Type 2 diabetes mellitus without complications: Secondary | ICD-10-CM | POA: Diagnosis not present

## 2018-11-13 DIAGNOSIS — I10 Essential (primary) hypertension: Secondary | ICD-10-CM | POA: Diagnosis not present

## 2018-11-13 NOTE — Telephone Encounter (Signed)
Gwinda Passe,  Patient called to give his PCP name Mackie Pai [phone].

## 2018-11-13 NOTE — Telephone Encounter (Signed)
Did you request this information? I see where surgery is pending for patient. PCP is already listed in chart.

## 2018-11-22 ENCOUNTER — Telehealth: Payer: Self-pay | Admitting: *Deleted

## 2018-11-22 NOTE — Telephone Encounter (Signed)
   [facility] Pre-operative Risk Assessment    Request for surgical clearance:  1. What type of surgery is being performed? LUMBAR MICRODISECTOMY   2. When is this surgery scheduled? TBD   3. What type of clearance is required (medical clearance vs. Pharmacy clearance to hold med vs. Both)? BOTH  4. Are there any medications that need to be held prior to surgery and how long? ELIQUIS    5. Practice name and name of physician performing surgery? [facility]; DR. Latta   6. What is your office phone number [phone]    7.   What is your office fax number [phone]  8.   Anesthesia type (None, local, MAC, general) ? GENERAL    Julaine Hua 11/22/2018, 10:03 AM  _________________________________________________________________   (provider comments below)

## 2018-11-22 NOTE — Telephone Encounter (Signed)
Patient with diagnosis of afib on Eliquis for anticoagulation.    Procedure: LUMBAR MICRODISECTOMY  Date of procedure: TBD  CHADS2-VASc score of  2 (HTN, DM2)  CrCl 104 ml/min  Per office protocol, patient can hold Eliquis for 3 days prior to procedure.

## 2018-11-25 ENCOUNTER — Telehealth: Payer: Self-pay | Admitting: Physician Assistant

## 2018-11-25 ENCOUNTER — Encounter: Payer: Self-pay | Admitting: Physician Assistant

## 2018-11-25 LAB — ECHOCARDIOGRAM COMPLETE
Height: 74 in
Weight: 4208.14 oz

## 2018-11-25 NOTE — Telephone Encounter (Signed)
Hi Traci, I am seeing this patient in clinic tomorrow. You read an echo on this patient back in 07/2018 and your echo report indicates a pacer wire in the RV. I do not see commentary on this finding by MD, only that Cecilie Kicks questioned whether it was a misprint as patient does not have any h/o pacemaker. Can you take a second look and let me know? Thanks, [employer]

## 2018-11-25 NOTE — Telephone Encounter (Signed)
No pacer,  Not sure how that got in there because the pacer was not even checked.  It will not let me erase it so I addended the study
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
## 2018-11-26 ENCOUNTER — Other Ambulatory Visit: Payer: Self-pay

## 2018-11-26 ENCOUNTER — Telehealth: Payer: Self-pay | Admitting: Medical

## 2018-11-26 ENCOUNTER — Ambulatory Visit (INDEPENDENT_AMBULATORY_CARE_PROVIDER_SITE_OTHER): Payer: BC Managed Care – PPO | Admitting: Physician Assistant

## 2018-11-26 ENCOUNTER — Encounter: Payer: Self-pay | Admitting: Physician Assistant

## 2018-11-26 VITALS — BP 124/80 | HR 53 | Ht 74.0 in | Wt 271.8 lb

## 2018-11-26 DIAGNOSIS — I480 Paroxysmal atrial fibrillation: Secondary | ICD-10-CM | POA: Diagnosis not present

## 2018-11-26 DIAGNOSIS — I4892 Unspecified atrial flutter: Secondary | ICD-10-CM

## 2018-11-26 DIAGNOSIS — I10 Essential (primary) hypertension: Secondary | ICD-10-CM | POA: Diagnosis not present

## 2018-11-26 DIAGNOSIS — Z01810 Encounter for preprocedural cardiovascular examination: Secondary | ICD-10-CM

## 2018-11-26 DIAGNOSIS — D649 Anemia, unspecified: Secondary | ICD-10-CM | POA: Diagnosis not present

## 2018-11-26 NOTE — Telephone Encounter (Signed)
Patient called stated [facility] faxed over a PA for patient surgery.   [place] fax [phone]

## 2018-11-26 NOTE — Telephone Encounter (Signed)
   Primary Cardiologist: [name], MD  Chart reviewed as part of pre-operative protocol coverage. I saw patient in clinic today. He has no high risk symptoms to suggest angina or ischemia. Therefore, based on ACC/AHA guidelines, [name] would be at acceptable risk for the planned procedure without further cardiovascular testing. PharmD reviewed his chart and states, "Per office protocol, patient can hold Eliquis for 3 days prior to procedure." We typically advise that blood thinners be resumed when felt safe by performing physician.  I will route this recommendation to the requesting party via Epic fax function and remove from pre-op pool.  Please call with questions.  Charlie Pitter, PA-C 11/26/2018, 3:43 PM

## 2018-11-26 NOTE — Telephone Encounter (Signed)
Thank you :)

## 2018-11-26 NOTE — Telephone Encounter (Signed)
Do you have form?

## 2018-11-26 NOTE — Patient Instructions (Signed)
Medication Instructions:  Your physician recommends that you continue on your current medications as directed. Please refer to the Current Medication list given to you today.  If you need a refill on your cardiac medications before your next appointment, please call your pharmacy.   Lab work: None ordered  If you have labs (blood work) drawn today and your tests are completely normal, you will receive your results only by: . MyChart Message (if you have MyChart) OR . A paper copy in the mail If you have any lab test that is abnormal or we need to change your treatment, we will call you to review the results.  Testing/Procedures: None ordered  Follow-Up: At [facility], you and your health needs are our priority.  As part of our continuing mission to provide you with exceptional heart care, we have created designated Provider Care Teams.  These Care Teams include your primary Cardiologist (physician) and Advanced Practice Providers (APPs -  Physician Assistants and Nurse Practitioners) who all work together to provide you with the care you need, when you need it. You will need a follow up appointment in 6 months.  Please call our office 2 months in advance to schedule this appointment.  You may see Jayadeep Varanasi, MD or one of the following Advanced Practice Providers on your designated Care Team:   Brittainy Simmons, PA-C Dayna Dunn, PA-C . Michele Lenze, PA-C  Any Other Special Instructions Will Be Listed Below (If Applicable).    

## 2018-11-26 NOTE — Telephone Encounter (Signed)
Pt filing health ins for surgery

## 2018-11-27 NOTE — Telephone Encounter (Signed)
I saw the form before I left work today. Cardiology has cleared him for surgery. But I have not seen him recently. Before I sign form want pt to have cbc, cmp and a1c. Will put future labs in. Please help pt get schedule for labs. See if he can get labs on Friday. Then early next week will fill out physical exam form and update surgeon on lab results/if 3 month blood sugar average controlled.

## 2018-11-29 NOTE — Telephone Encounter (Signed)
Pt does not need PA from [place]. Pt was cleared by cardiologist.

## 2018-12-04 ENCOUNTER — Telehealth: Payer: Self-pay | Admitting: Medical

## 2018-12-04 NOTE — Telephone Encounter (Addendum)
If I am expected to sign off on his physical  Exam form then he will need to do labs as I have to attest he is cleared overall. Cardiology one component of his medical history to consider.  So need to get those labs if ortho waiting for me to sign off on the form.

## 2018-12-28 ENCOUNTER — Other Ambulatory Visit: Payer: Self-pay | Admitting: Medical

## 2019-01-08 ENCOUNTER — Other Ambulatory Visit: Payer: Self-pay

## 2019-01-08 ENCOUNTER — Encounter: Payer: Self-pay | Admitting: Surgery

## 2019-01-08 ENCOUNTER — Ambulatory Visit (INDEPENDENT_AMBULATORY_CARE_PROVIDER_SITE_OTHER): Payer: No Typology Code available for payment source | Admitting: Surgery

## 2019-01-08 VITALS — BP 131/88 | HR 52 | Ht 74.0 in | Wt 271.8 lb

## 2019-01-08 DIAGNOSIS — M48062 Spinal stenosis, lumbar region with neurogenic claudication: Secondary | ICD-10-CM

## 2019-01-08 NOTE — Progress Notes (Signed)
53 year old black male history of L4-5 HNP/gnosis comes in for preop evaluation.  States that symptoms are unchanged from previous visit with low back pain and bilateral lower extremity radiculopathy.  He is wanting to proceed with L4-5 decompression/microdiscectomy as scheduled.  We have received preop cardiac clearance.  Patient will stop his Eliquis 3 days preop.

## 2019-01-13 ENCOUNTER — Ambulatory Visit (INDEPENDENT_AMBULATORY_CARE_PROVIDER_SITE_OTHER): Payer: BC Managed Care – PPO | Admitting: Medical

## 2019-01-13 ENCOUNTER — Telehealth: Payer: Self-pay | Admitting: Medical

## 2019-01-13 ENCOUNTER — Encounter: Payer: Self-pay | Admitting: Medical

## 2019-01-13 ENCOUNTER — Other Ambulatory Visit: Payer: Self-pay

## 2019-01-13 VITALS — BP 139/88 | HR 60 | Temp 96.6°F | Resp 16 | Ht 74.0 in | Wt 279.6 lb

## 2019-01-13 DIAGNOSIS — E1169 Type 2 diabetes mellitus with other specified complication: Secondary | ICD-10-CM

## 2019-01-13 DIAGNOSIS — M79671 Pain in right foot: Secondary | ICD-10-CM

## 2019-01-13 DIAGNOSIS — I10 Essential (primary) hypertension: Secondary | ICD-10-CM

## 2019-01-13 DIAGNOSIS — D649 Anemia, unspecified: Secondary | ICD-10-CM | POA: Diagnosis not present

## 2019-01-13 LAB — CBC WITH DIFFERENTIAL/PLATELET
Basophils Absolute: 0 10*3/uL (ref 0.0–0.1)
Basophils Relative: 0.4 % (ref 0.0–3.0)
Eosinophils Absolute: 0.3 10*3/uL (ref 0.0–0.7)
Eosinophils Relative: 3 % (ref 0.0–5.0)
HCT: 41.1 % (ref 39.0–52.0)
Hemoglobin: 13.3 g/dL (ref 13.0–17.0)
Lymphocytes Relative: 34.6 % (ref 12.0–46.0)
Lymphs Abs: 3 10*3/uL (ref 0.7–4.0)
MCHC: 32.5 g/dL (ref 30.0–36.0)
MCV: 85.1 fl (ref 78.0–100.0)
Monocytes Absolute: 0.5 10*3/uL (ref 0.1–1.0)
Monocytes Relative: 6.1 % (ref 3.0–12.0)
Neutro Abs: 4.9 10*3/uL (ref 1.4–7.7)
Neutrophils Relative %: 55.9 % (ref 43.0–77.0)
Platelets: 215 10*3/uL (ref 150.0–400.0)
RBC: 4.83 Mil/uL (ref 4.22–5.81)
RDW: 14.7 % (ref 11.5–15.5)
WBC: 8.7 10*3/uL (ref 4.0–10.5)

## 2019-01-13 LAB — COMPREHENSIVE METABOLIC PANEL
ALT: 24 U/L (ref 0–53)
AST: 18 U/L (ref 0–37)
Albumin: 4.5 g/dL (ref 3.5–5.2)
Alkaline Phosphatase: 91 U/L (ref 39–117)
BUN: 18 mg/dL (ref 6–23)
CO2: 29 mEq/L (ref 19–32)
Calcium: 9.4 mg/dL (ref 8.4–10.5)
Chloride: 101 mEq/L (ref 96–112)
Creatinine, Ser: 1.27 mg/dL (ref 0.40–1.50)
GFR: 72.34 mL/min (ref >60.00)
Glucose, Bld: 96 mg/dL (ref 70–99)
Potassium: 4.5 mEq/L (ref 3.5–5.1)
Sodium: 139 mEq/L (ref 135–145)
Total Bilirubin: 0.7 mg/dL (ref 0.2–1.2)
Total Protein: 7.2 g/dL (ref 6.0–8.3)

## 2019-01-13 LAB — URIC ACID: Uric Acid, Serum: 8.2 mg/dL — ABNORMAL HIGH (ref 4.0–7.8)

## 2019-01-13 LAB — HEMOGLOBIN A1C: Hgb A1c MFr Bld: 6.3 % (ref 4.6–6.5)

## 2019-01-13 MED ORDER — COLCHICINE 0.6 MG PO CAPS: ORAL_CAPSULE | ORAL | 0 refills | Status: DC

## 2019-01-13 MED ORDER — LISINOPRIL 20 MG PO TABS: 20.0000 mg | ORAL_TABLET | Freq: Every day | ORAL | 1 refills | Status: DC
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
## 2019-01-13 NOTE — Telephone Encounter (Signed)
rx colchicine sent to pt pharmacy.

## 2019-01-13 NOTE — Patient Instructions (Addendum)
Your bp was better on recheck. Continue current bp medication. Restart your lisinopril  as you have been off med for one week.  For atrial fibrillation continue current meds. Cleared by cardiologist for surgery.  For diabetes get a1c and cmp today.  For hx of anemia will get cbc.  Offered flu vaccine but pt declined.  For foot pain will get uric acid today. Xray deferred today.  Follow up date to be determined after lab review.

## 2019-01-14 NOTE — Pre-Procedure Instructions (Signed)
[name]  01/14/2019      [id number] - [place], [place] - [address] AT [facility] [address] [place] [address] Phone: [phone] Fax: [phone]  [place]  [place] [address] Phone: [phone] Fax: [phone]    Your procedure is scheduled on 01/22/19.  Report to [facility] Admitting at 1030 A.M.  Call this number if you have problems the morning of surgery:  [phone]   Remember:  Do not eat or drink after midnight.  You may drink clear liquids until 930 .  Clear liquids allowed are:                    Juice (non-citric and without pulp), Carbonated beverages, Clear Tea and Black Coffee only  Please complete your PRE-SURGERY ENSURE that was provided to you by ... the morning of surgery.  Please, if able, drink it in one setting. DO NOT SIP.  Take these medicines the morning of surgery with A SIP OF WATER ---COLCHICINE,LOPRESSOR    Do not wear jewelry, make-up or nail polish.  Do not wear lotions, powders, or perfumes, or deodorant.  Do not shave 48 hours prior to surgery.  Men may shave face and neck.  Do not bring valuables to the hospital.  [facility] is not responsible for any belongings or valuables.  Contacts, dentures or bridgework may not be worn into surgery.  Leave your suitcase in the car.  After surgery it may be brought to your room.  For patients admitted to the hospital, discharge time will be determined by your treatment team.  Patients discharged the day of surgery will not be allowed to drive home.  Do not take any aspirin,anti-inflammatories,vitamins,or herbal supplements 5-7 days prior to surgery.  Special instructions:  [place] - Preparing for Surgery  Before surgery, you can play an important role.  Because skin is not sterile, your skin needs to be as free  of germs as possible.  You can reduce the number of germs on you skin by washing with CHG (chlorahexidine gluconate) soap before surgery.  CHG is an antiseptic cleaner which kills germs and bonds with the skin to continue killing germs even after washing.  Oral Hygiene is also important in reducing the risk of infection.  Remember to brush your teeth with your regular toothpaste the morning of surgery.  Please DO NOT use if you have an allergy to CHG or antibacterial soaps.  If your skin becomes reddened/irritated stop using the CHG and inform your nurse when you arrive at Short Stay.  Do not shave (including legs and underarms) for at least 48 hours prior to the first CHG shower.  You may shave your face.  Please follow these instructions carefully:   1.  Shower with CHG Soap the night before surgery and the morning of Surgery.  2.  If you choose to wash your hair, wash your hair first as usual with your normal shampoo.  3.  After you shampoo, rinse your hair and body thoroughly to remove the shampoo. 4.  Use CHG as you would any other liquid soap.  You can apply chg directly to the skin and wash gently with a      scrungie or washcloth.  5.  Apply the CHG Soap to your body ONLY FROM THE NECK DOWN.   Do not use on open wounds or open sores. Avoid contact with your eyes, ears, mouth and genitals (private parts).  Wash genitals (private parts) with your normal soap.  6.  Wash thoroughly, paying special attention to the area where your surgery will be performed.  7.  Thoroughly rinse your body with warm water from the neck down.  8.  DO NOT shower/wash with your normal soap after using and rinsing off the CHG Soap.  9.  Pat yourself dry with a clean towel.            10.  Wear clean pajamas.            11.  Place clean sheets on your bed the night of your first shower and do not sleep with pets.  Day of Surgery  Do not apply any lotions/deoderants the morning of surgery.   Please wear  clean clothes to the hospital/surgery center. Remember to brush your teeth with toothpaste.    Please read over the following fact sheets that you were given. MRSA Information

## 2019-01-15 ENCOUNTER — Ambulatory Visit (HOSPITAL_COMMUNITY)
Admission: RE | Admit: 2019-01-15 | Discharge: 2019-01-15 | Disposition: A | Discharge status: Home / Self Care | Payer: BC Managed Care – PPO | Source: Ambulatory Visit | Attending: Surgery | Admitting: Surgery

## 2019-01-15 ENCOUNTER — Other Ambulatory Visit: Payer: Self-pay

## 2019-01-15 ENCOUNTER — Encounter (HOSPITAL_COMMUNITY): Payer: Self-pay

## 2019-01-15 ENCOUNTER — Encounter (HOSPITAL_COMMUNITY)
Admission: RE | Admit: 2019-01-15 | Discharge: 2019-01-15 | Disposition: A | Discharge status: Home / Self Care | Payer: BC Managed Care – PPO | Source: Ambulatory Visit | Attending: Orthopaedic Surgery | Admitting: Orthopaedic Surgery

## 2019-01-15 DIAGNOSIS — Z01818 Encounter for other preprocedural examination: Secondary | ICD-10-CM

## 2019-01-15 HISTORY — DX: Gout, unspecified: M10.9

## 2019-01-15 HISTORY — DX: Gastro-esophageal reflux disease without esophagitis: K21.9

## 2019-01-15 LAB — URINALYSIS, ROUTINE W REFLEX MICROSCOPIC
Bilirubin Urine: NEGATIVE
Glucose, UA: NEGATIVE mg/dL
Hgb urine dipstick: NEGATIVE
Ketones, ur: NEGATIVE mg/dL
Leukocytes,Ua: NEGATIVE
Nitrite: NEGATIVE
Protein, ur: NEGATIVE mg/dL
Specific Gravity, Urine: 1.016 (ref 1.005–1.030)
pH: 5 (ref 5.0–8.0)

## 2019-01-15 LAB — GLUCOSE, CAPILLARY: Glucose-Capillary: 107 mg/dL — ABNORMAL HIGH (ref 70–99)

## 2019-01-15 LAB — SURGICAL PCR SCREEN
MRSA, PCR: NEGATIVE
Staphylococcus aureus: NEGATIVE

## 2019-01-15 NOTE — Progress Notes (Signed)
PCP - Dr. Justine Null  Cardiologist - Dr. Ashok Norris  Chest x-ray - 01/15/2019  EKG - 11/26/2018 (E)  Stress Test - Denies  ECHO - 08/03/2018 (E)   Cardiac Cath - Denies  AICD-na PM-na LOOP-na  Sleep Study - Denies CPAP - Denies  LABS- 01/13/2019: CBC w/D, CMP 01/18/2019: COVID  ASA- Denies Eliquis- LD- 10/31  ERAS- Yes- G2 given  HA1C- 01/13/2019: 6.3 (E) Fasting Blood Sugar - 74-150, today 110 Checks Blood Sugar ___1__ times a day  Anesthesia- Yes- c.c. 11/26/2018 (E)  Pt denies having chest pain, sob, or fever at this time. All instructions explained to the pt, with a verbal understanding of the material. Pt agrees to go over the instructions while at home for a better understanding. Pt also instructed to self quarantine after being tested for COVID-19. The opportunity to ask questions was provided.   Coronavirus Screening  Have you experienced the following symptoms:  Cough yes/no: No Fever (>100.19F)  yes/no: No Runny nose yes/no: No Sore throat yes/no: No Difficulty breathing/shortness of breath  yes/no: No  Have you or a family member traveled in the last 14 days and where? yes/no: No   If the patient indicates "YES" to the above questions, their PAT will be rescheduled to limit the exposure to others and, the surgeon will be notified. THE PATIENT WILL NEED TO BE ASYMPTOMATIC FOR 14 DAYS.   If the patient is not experiencing any of these symptoms, the PAT nurse will instruct them to NOT bring anyone with them to their appointment since they may have these symptoms or traveled as well.   Please remind your patients and families that hospital visitation restrictions are in effect and the importance of the restrictions.

## 2019-01-15 NOTE — Pre-Procedure Instructions (Signed)
[id number] [place], [place] - [address] AT [facility] [address] [place] [id number] Phone: [phone] Fax: [phone]  [facility] [place] [facility] [place] [id number] [place] [address] Phone: [phone] Fax: [phone]    Your procedure is scheduled on Wed. Nov. 4, 2020 from 12:30PM-2:39PM  [facility] at 10:30AM  Call this number if you have problems the morning of surgery:  [phone]   Remember:  Do not eat after midnight on Nov. 3rd  You may drink clear liquids until 3 hours (9:30AM) prior to surgery time .  Clear liquids allowed are:  Water, Juice (non-citric and without pulp), Carbonated beverages, Clear Tea, Black Coffee only, Plain Jell-O only, Gatorade and Plain Popsicles only   Enhanced Recovery after Surgery for Orthopedics Enhanced Recovery after Surgery is a protocol used to improve the stress on your body and your recovery after surgery.  Patient Instructions  . The day of surgery (if you have diabetes): Drink by (9:30AM) o  o Drink ONE (1) Gatorade 2 (G2) as directed. o This drink was given to you during your hospital  pre-op appointment visit.  o Color of the Gatorade may vary. Red is not allowed. o Nothing else to drink after completing the  Gatorade 2 (G2).         If you have questions, please contact your surgeon's office.     Take these medicines the morning of surgery with A SIP OF WATER: Atorvastatin (LIPITOR) Colchicine  Metoprolol tartrate (LOPRESSOR)  Take last dose of Eliquis on Fri. 10/31, per Dr. Ophelia CharterYates.  As of today, stop taking all Aspirin (unless instructed by your doctor) and Other Aspirin containing products, Vitamins, Fish oils, and Herbal medications. Also stop all NSAIDS i.e. Advil, Ibuprofen, Motrin, Aleve, Anaprox, Naproxen, BC, Goody Powders, and all Supplements.    . Do not take evening dose of GlipiZIDE (GLUCOTROL) on 11/3 . Do not take GlipiZIDE (GLUCOTROL) the morning of surgery on 11/4  How to Manage Your Diabetes Before and After Surgery  Why is it important to control my blood sugar before and after surgery? . Improving blood sugar levels before and after surgery helps healing and can limit problems. . A way of improving blood sugar control is eating a healthy diet by: o  Eating less sugar and carbohydrates o  Increasing activity/exercise o  Talking with your doctor about reaching your blood sugar goals . High blood sugars (greater than 180 mg/dL) can raise your risk of infections and slow your recovery, so you will need to focus on controlling your diabetes during the weeks before surgery. . Make sure that the doctor who takes care of your diabetes knows about your planned surgery including the date and location.  How do I manage my blood sugar before surgery? . Check your blood sugar at least 4 times a day, starting 2 days before surgery, to make sure that the level is not too high or low. o Check your blood sugar the morning of your surgery when you wake up and every 2 hours until you get to the Short Stay unit. . If your blood sugar is less than 70 mg/dL, you will need to treat for low blood sugar: o Do not take insulin. o Treat a low blood sugar (less than 70 mg/dL) with  cup of clear juice (  cranberry or apple), 4 glucose tablets, OR glucose gel. Recheck blood sugar in 15 minutes after treatment (to make sure it is greater than 70 mg/dL). If your blood sugar is not greater than 70 mg/dL on recheck, call [phone] o  for further instructions. . If your CBG is greater than 220 mg/dL, call the number above for further instructions.  . If you are admitted to the hospital after surgery: o Your blood sugar will be checked by the staff and you will probably be given insulin after surgery (instead of oral diabetes medicines) to make sure you  have good blood sugar levels. o The goal for blood sugar control after surgery is 80-180 mg/dL.  Reviewed and Endorsed by [facility] Patient Education Committee, August 2015   No Smoking of any kind, Tobacco, or Alcohol products 24 hours prior to your procedure.  Special instructions:  [place]- Preparing For Surgery  Before surgery, you can play an important role. Because skin is not sterile, your skin needs to be as free of germs as possible. You can reduce the number of germs on your skin by washing with CHG (chlorahexidine gluconate) Soap before surgery.  CHG is an antiseptic cleaner which kills germs and bonds with the skin to continue killing germs even after washing.   Please do not use if you have an allergy to CHG or antibacterial soaps. If your skin becomes reddened/irritated stop using the CHG.  Do not shave (including legs and underarms) for at least 48 hours prior to first CHG shower. It is OK to shave your face.  Please follow these instructions carefully.   1. Shower the NIGHT BEFORE SURGERY and the MORNING OF SURGERY with CHG.   2. If you chose to wash your hair, wash your hair first as usual with your normal shampoo.  3. After you shampoo, rinse your hair and body thoroughly to remove the shampoo.  4. Use CHG as you would any other liquid soap. You can apply CHG directly to the skin and wash gently with a scrungie or a clean washcloth.   5. Apply the CHG Soap to your body ONLY FROM THE NECK DOWN.  Do not use on open wounds or open sores. Avoid contact with your eyes, ears, mouth and genitals (private parts). Wash Face and genitals (private parts)  with your normal soap.  6. Wash thoroughly, paying special attention to the area where your surgery will be performed.  7. Thoroughly rinse your body with warm water from the neck down.  8. DO NOT shower/wash with your normal soap after using and rinsing off the CHG Soap.  9. Pat yourself dry with a CLEAN TOWEL.  10.  Wear CLEAN PAJAMAS to bed the night before surgery, wear comfortable clothes the morning of surgery  11. Place CLEAN SHEETS on your bed the night of your first shower and DO NOT SLEEP WITH PETS.   Day of Surgery:             Remember to brush your teeth WITH YOUR REGULAR TOOTHPASTE.   Do not wear jewelry.  Do not wear lotions, powders, colognes, or deodorant.  Do not shave 48 hours prior to surgery.  Men may shave face and neck.  Do not bring valuables to the hospital.  [facility] is not responsible for any belongings or valuables.  Contacts, dentures or bridgework may not be worn into surgery.    For patients admitted to the hospital, discharge time will be determined by your  treatment team.  Patients discharged the day of surgery will not be allowed to drive home, and someone age 62 and over needs to stay with them for 24 hours.   Please wear clean clothes to the hospital/surgery center.    Please read over the following fact sheets that you were given.

## 2019-01-16 ENCOUNTER — Encounter (HOSPITAL_COMMUNITY): Payer: Self-pay | Admitting: Vascular Surgery
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
## 2019-01-16 NOTE — Anesthesia Preprocedure Evaluation (Deleted)
Anesthesia Evaluation    Airway        Dental   Pulmonary           Cardiovascular hypertension,      Neuro/Psych    GI/Hepatic   Endo/Other  diabetes  Renal/GU      Musculoskeletal   Abdominal   Peds  Hematology   Anesthesia Other Findings   Reproductive/Obstetrics                             Anesthesia Physical Anesthesia Plan  ASA:   Anesthesia Plan:    Post-op Pain Management:    Induction:   PONV Risk Score and Plan:   Airway Management Planned:   Additional Equipment:   Intra-op Plan:   Post-operative Plan:   Informed Consent:   Plan Discussed with:   Anesthesia Plan Comments: (PAT note written 01/16/2019 by Myra Gianotti, PA-C. )        Anesthesia Quick Evaluation

## 2019-01-18 ENCOUNTER — Other Ambulatory Visit (HOSPITAL_COMMUNITY)
Admission: RE | Admit: 2019-01-18 | Discharge: 2019-01-18 | Disposition: A | Discharge status: Home / Self Care | Payer: BC Managed Care – PPO | Source: Ambulatory Visit | Attending: Orthopaedic Surgery | Admitting: Orthopaedic Surgery

## 2019-01-18 DIAGNOSIS — Z01812 Encounter for preprocedural laboratory examination: Secondary | ICD-10-CM | POA: Insufficient documentation

## 2019-01-18 DIAGNOSIS — Z20828 Contact with and (suspected) exposure to other viral communicable diseases: Secondary | ICD-10-CM | POA: Insufficient documentation

## 2019-01-19 LAB — NOVEL CORONAVIRUS, NAA (HOSP ORDER, SEND-OUT TO REF LAB; TAT 18-24 HRS): SARS-CoV-2, NAA: NOT DETECTED

## 2019-01-21 ENCOUNTER — Telehealth: Payer: Self-pay | Admitting: Radiology

## 2019-01-21 DIAGNOSIS — M4807 Spinal stenosis, lumbosacral region: Secondary | ICD-10-CM

## 2019-01-21 MED ORDER — DEXTROSE 5 % IV SOLN
3.0000 g | INTRAVENOUS | Status: DC
  Filled 2019-01-21 (×2): qty 3000

## 2019-01-21 NOTE — Telephone Encounter (Signed)
I called patient and advised surgery has been denied by insurance due to not having imaging studies done in the last 6 months. Advised patient that we would enter MRI order and have that repeated. He will follow up in the office after MRI to review and attempt to reschedule surgery. Patient advised to restart Eliquis today.   MRI ordered with note to Gabriel Cirri that patient would like MRI scheduled as soon as possible so that he can get authorization for surgery.  Debbie--please cancel patient's surgery for 01/22/2019.

## 2019-01-22 ENCOUNTER — Encounter (HOSPITAL_COMMUNITY): Admission: RE | Payer: Self-pay | Source: Home / Self Care

## 2019-01-22 ENCOUNTER — Ambulatory Visit (HOSPITAL_COMMUNITY)
Admission: RE | Admit: 2019-01-22 | Payer: BC Managed Care – PPO | Source: Home / Self Care | Admitting: Orthopaedic Surgery

## 2019-01-22 SURGERY — LUMBAR LAMINECTOMY/DECOMPRESSION MICRODISCECTOMY
Anesthesia: General

## 2019-01-31 ENCOUNTER — Inpatient Hospital Stay: Payer: BC Managed Care – PPO | Admitting: Orthopaedic Surgery

## 2019-01-31 ENCOUNTER — Other Ambulatory Visit: Payer: Self-pay

## 2019-01-31 ENCOUNTER — Ambulatory Visit (HOSPITAL_COMMUNITY)
Admission: RE | Admit: 2019-01-31 | Discharge: 2019-01-31 | Disposition: A | Discharge status: Home / Self Care | Payer: BC Managed Care – PPO | Source: Ambulatory Visit | Attending: Orthopaedic Surgery | Admitting: Orthopaedic Surgery

## 2019-01-31 ENCOUNTER — Encounter (HOSPITAL_COMMUNITY): Payer: Self-pay | Admitting: Radiology

## 2019-01-31 DIAGNOSIS — M4807 Spinal stenosis, lumbosacral region: Secondary | ICD-10-CM | POA: Insufficient documentation

## 2019-01-31 DIAGNOSIS — M5126 Other intervertebral disc displacement, lumbar region: Secondary | ICD-10-CM | POA: Diagnosis not present

## 2019-01-31 DIAGNOSIS — M48062 Spinal stenosis, lumbar region with neurogenic claudication: Secondary | ICD-10-CM | POA: Diagnosis not present

## 2019-01-31 DIAGNOSIS — M6281 Muscle weakness (generalized): Secondary | ICD-10-CM | POA: Diagnosis not present

## 2019-02-04 ENCOUNTER — Telehealth: Payer: Self-pay

## 2019-02-04 NOTE — Telephone Encounter (Signed)
Either way fine, they wanted a new MRI, they got a new MRI . Just wasting health care dollars. . Thank you.

## 2019-02-04 NOTE — Telephone Encounter (Signed)
-----   Message from [place], [place] sent at 02/03/2019  3:05 PM EST ----- Sending to you both for surgery.  Thanks. ----- Message ----- From: Marybelle Killings, MD Sent: 02/03/2019   2:41 PM EST To: Carmine Savoy, RT  Send ahead for surgery pre-cert with copy of this new MRI that  They requested thanks

## 2019-02-04 NOTE — Telephone Encounter (Signed)
IC [employer] regarding surgery auth since pt has had MRI. Rep says that since it has been within 30 days of the denial we still have the option to set up a peer to peer. Another option is to request an appeal by faxing in the clinical but this could take longer for a decision. Please advise on which you would prefer me do.

## 2019-02-05 NOTE — Telephone Encounter (Signed)
Peer to peer completed.

## 2019-02-05 NOTE — Telephone Encounter (Signed)
P2P set up for today @ 10:45. Gave triage phone # for them to call. B[place]y and triage aware.

## 2019-02-07 ENCOUNTER — Other Ambulatory Visit: Payer: Self-pay

## 2019-02-19 ENCOUNTER — Encounter: Payer: Self-pay | Admitting: Surgery

## 2019-02-19 ENCOUNTER — Other Ambulatory Visit: Payer: Self-pay

## 2019-02-19 ENCOUNTER — Ambulatory Visit (INDEPENDENT_AMBULATORY_CARE_PROVIDER_SITE_OTHER): Payer: No Typology Code available for payment source | Admitting: Surgery

## 2019-02-19 VITALS — BP 142/99 | HR 48

## 2019-02-19 DIAGNOSIS — M5126 Other intervertebral disc displacement, lumbar region: Secondary | ICD-10-CM

## 2019-02-19 DIAGNOSIS — M4807 Spinal stenosis, lumbosacral region: Secondary | ICD-10-CM

## 2019-02-19 DIAGNOSIS — M5136 Other intervertebral disc degeneration, lumbar region: Secondary | ICD-10-CM

## 2019-02-19 NOTE — Progress Notes (Addendum)
53 year old black male with history of L4-5 HNP stenosis comes in for preop evaluation.  States that previous symptoms unchanged from last office visit.  He is wanting to proceed with L4-5 decompression/microdiscectomy scheduled.  Patient received preop cardiac clearance September 2020.  States that he has not been seen by his cardiologist since then.  Today history and physical performed.  Patient complains of intermittent pain around the right shoulder blade and this at times radiates around to his right anterior lower ribs.  This has been going on about a month.  he states that this is sometimes brought on when he leans to his right side while sitting take pressure off his left buttock.  Denies any associated palpitations.  Denies dysuria, hematuria, neck pain.  Patient has a documented history of elevated LFTs.  Plan We will proceed with surgery as scheduled.  We will check routine labs along with chest x-ray and EKG.  With his history of elevated LFTs and current pain in the area that he describes wonder if this at all could be related to his gallbladder.  We will look at those labs when they return.  All questions answered.

## 2019-02-20 DIAGNOSIS — E119 Type 2 diabetes mellitus without complications: Secondary | ICD-10-CM | POA: Diagnosis not present

## 2019-02-20 DIAGNOSIS — I10 Essential (primary) hypertension: Secondary | ICD-10-CM | POA: Diagnosis not present

## 2019-02-25 NOTE — Pre-Procedure Instructions (Signed)
[id number] - [place], [place] - [address] AT [facility] [address] [place] [address] Phone: [phone] Fax: [phone]  [place] [address] [place] [address] Phone: [phone] Fax: [phone]     Your procedure is scheduled on Wednesday, December 16th, from 12:30 PM to 2:39 PM.  Report to [facility] Main Entrance "A" at 10:30 A.M., and check in at the Admitting office.  Call this number if you have problems the morning of surgery:  [phone]    Remember:  Do not eat after midnight the night before your surgery.  You may drink clear liquids until 09:30 AM the morning of your surgery.   Clear liquids allowed are: Water, Non-Citrus Juices (without pulp), Carbonated Beverages, Clear Tea, Black Coffee Only, and Gatorade.    Take these medicines the morning of surgery with A SIP OF WATER: atorvastatin (LIPITOR) metoprolol tartrate (LOPRESSOR)  IF NEEDED: Colchicine sodium chloride (OCEAN) nasal spray  As of today, STOP taking any Aspirin (unless otherwise instructed by your surgeon), Aleve, Naproxen, Ibuprofen, Motrin, Advil, Goody's, BC's, all herbal medications, fish oil, and all vitamins.   WHAT DO I DO ABOUT MY DIABETES MEDICATION?  Marland Kitchen Do not take glipiZIDE (GLUCOTROL) the evening before surgery or the morning of surgery.  How do I manage my blood sugar before surgery? . Check your blood sugar at least 4 times a day, starting 2 days before surgery, to make sure that the level is not too high or low. . Check your blood sugar the morning of your surgery when you wake up and every 2 hours until you get to the Short Stay unit. o If your blood sugar is less than 70 mg/dL, you will need to treat for low blood sugar: - Do not take insulin. - Treat a low blood sugar (less than 70 mg/dL) with  cup of clear juice  (cranberry or apple), 4 glucose tablets, OR glucose gel. - Recheck blood sugar in 15 minutes after treatment (to make sure it is greater than 70 mg/dL). If your blood sugar is not greater than 70 mg/dL on recheck, call [phone] for further instructions. . Report your blood sugar to the short stay nurse when you get to Short Stay.  . If you are admitted to the hospital after surgery: o Your blood sugar will be checked by the staff and you will probably be given insulin after surgery (instead of oral diabetes medicines) to make sure you have good blood sugar levels. o The goal for blood sugar control after surgery is 80-180 mg/dL.    The Morning of Surgery  Do not wear jewelry.  Do not wear lotions, powders, colognes, or deodorant  Men may shave face and neck.  Do not bring valuables to the hospital.  [facility] is not responsible for any belongings or valuables.  If you are a smoker, DO NOT Smoke 24 hours prior to surgery  If you wear a CPAP at night please bring your mask, tubing, and machine the morning of surgery   Remember that you must have someone to transport you home after your surgery, and remain with you for 24 hours if you are discharged the same day.   Please bring cases for contacts, glasses, hearing aids, dentures or bridgework because it cannot be worn into surgery.    Leave your suitcase  in the car.  After surgery it may be brought to your room.  For patients admitted to the hospital, discharge time will be determined by your treatment team.  Patients discharged the day of surgery will not be allowed to drive home.    Special instructions:   [facility]- Preparing For Surgery  Before surgery, you can play an important role. Because skin is not sterile, your skin needs to be as free of germs as possible. You can reduce the number of germs on your skin by washing with CHG (chlorahexidine gluconate) Soap before surgery.  CHG is an antiseptic cleaner which kills  germs and bonds with the skin to continue killing germs even after washing.    Oral Hygiene is also important to reduce your risk of infection.  Remember - BRUSH YOUR TEETH THE MORNING OF SURGERY WITH YOUR REGULAR TOOTHPASTE  Please do not use if you have an allergy to CHG or antibacterial soaps. If your skin becomes reddened/irritated stop using the CHG.  Do not shave (including legs and underarms) for at least 48 hours prior to first CHG shower. It is OK to shave your face.  Please follow these instructions carefully.   1. Shower the NIGHT BEFORE SURGERY and the MORNING OF SURGERY with CHG Soap.   2. If you chose to wash your hair, wash your hair first as usual with your normal shampoo.  3. After you shampoo, rinse your hair and body thoroughly to remove the shampoo.  4. Use CHG as you would any other liquid soap. You can apply CHG directly to the skin and wash gently with a scrungie or a clean washcloth.   5. Apply the CHG Soap to your body ONLY FROM THE NECK DOWN.  Do not use on open wounds or open sores. Avoid contact with your eyes, ears, mouth and genitals (private parts). Wash Face and genitals (private parts)  with your normal soap.   6. Wash thoroughly, paying special attention to the area where your surgery will be performed.  7. Thoroughly rinse your body with warm water from the neck down.  8. DO NOT shower/wash with your normal soap after using and rinsing off the CHG Soap.  9. Pat yourself dry with a CLEAN TOWEL.  10. Wear CLEAN PAJAMAS to bed the night before surgery, wear comfortable clothes the morning of surgery  11. Place CLEAN SHEETS on your bed the night of your first shower and DO NOT SLEEP WITH PETS.    Day of Surgery:   Remember to brush your teeth WITH YOUR REGULAR TOOTHPASTE. Please shower the morning of surgery with the CHG soap Do not apply any deodorants/lotions. Please wear clean clothes to the hospital/surgery center.      Please read over the  following fact sheets that you were given.

## 2019-02-26 ENCOUNTER — Encounter (HOSPITAL_COMMUNITY)
Admission: RE | Admit: 2019-02-26 | Discharge: 2019-02-26 | Disposition: A | Discharge status: Home / Self Care | Payer: BC Managed Care – PPO | Source: Ambulatory Visit | Attending: Orthopaedic Surgery | Admitting: Orthopaedic Surgery

## 2019-02-26 ENCOUNTER — Encounter (HOSPITAL_COMMUNITY): Payer: Self-pay

## 2019-02-26 ENCOUNTER — Other Ambulatory Visit: Payer: Self-pay

## 2019-02-26 ENCOUNTER — Encounter (HOSPITAL_COMMUNITY)
Admission: RE | Admit: 2019-02-26 | Discharge: 2019-02-26 | Disposition: A | Discharge status: Home / Self Care | Payer: BC Managed Care – PPO | Source: Ambulatory Visit | Attending: Surgery | Admitting: Surgery

## 2019-02-26 DIAGNOSIS — Z09 Encounter for follow-up examination after completed treatment for conditions other than malignant neoplasm: Secondary | ICD-10-CM

## 2019-02-26 DIAGNOSIS — R9431 Abnormal electrocardiogram [ECG] [EKG]: Secondary | ICD-10-CM | POA: Diagnosis not present

## 2019-02-26 DIAGNOSIS — R001 Bradycardia, unspecified: Secondary | ICD-10-CM | POA: Insufficient documentation

## 2019-02-26 DIAGNOSIS — E1110 Type 2 diabetes mellitus with ketoacidosis without coma: Secondary | ICD-10-CM | POA: Insufficient documentation

## 2019-02-26 DIAGNOSIS — I4892 Unspecified atrial flutter: Secondary | ICD-10-CM | POA: Diagnosis not present

## 2019-02-26 DIAGNOSIS — Z01818 Encounter for other preprocedural examination: Secondary | ICD-10-CM | POA: Diagnosis not present

## 2019-02-26 HISTORY — DX: Cardiac arrhythmia, unspecified: I49.9

## 2019-02-26 HISTORY — DX: Depression, unspecified: F32.A

## 2019-02-26 HISTORY — DX: Cardiac murmur, unspecified: R01.1

## 2019-02-26 LAB — URINALYSIS, ROUTINE W REFLEX MICROSCOPIC
Bilirubin Urine: NEGATIVE
Glucose, UA: NEGATIVE mg/dL
Hgb urine dipstick: NEGATIVE
Ketones, ur: NEGATIVE mg/dL
Leukocytes,Ua: NEGATIVE
Nitrite: NEGATIVE
Protein, ur: NEGATIVE mg/dL
Specific Gravity, Urine: 1.014 (ref 1.005–1.030)
pH: 5 (ref 5.0–8.0)

## 2019-02-26 LAB — COMPREHENSIVE METABOLIC PANEL
ALT: 44 U/L (ref 0–44)
AST: 26 U/L (ref 15–41)
Albumin: 4 g/dL (ref 3.5–5.0)
Alkaline Phosphatase: 68 U/L (ref 38–126)
Anion gap: 10 (ref 5–15)
BUN: 14 mg/dL (ref 6–20)
CO2: 25 mmol/L (ref 22–32)
Calcium: 9.2 mg/dL (ref 8.9–10.3)
Chloride: 103 mmol/L (ref 98–111)
Creatinine, Ser: 1.25 mg/dL — ABNORMAL HIGH (ref 0.61–1.24)
GFR calc Af Amer: >60 mL/min (ref >60)
GFR calc non Af Amer: >60 mL/min (ref >60)
Glucose, Bld: 109 mg/dL — ABNORMAL HIGH (ref 70–99)
Potassium: 4.5 mmol/L (ref 3.5–5.1)
Sodium: 138 mmol/L (ref 135–145)
Total Bilirubin: 0.8 mg/dL (ref 0.3–1.2)
Total Protein: 7.7 g/dL (ref 6.5–8.1)

## 2019-02-26 LAB — GLUCOSE, CAPILLARY: Glucose-Capillary: 124 mg/dL — ABNORMAL HIGH (ref 70–99)

## 2019-02-26 LAB — CBC
HCT: 46 % (ref 39.0–52.0)
Hemoglobin: 14.2 g/dL (ref 13.0–17.0)
MCH: 27.6 pg (ref 26.0–34.0)
MCHC: 30.9 g/dL (ref 30.0–36.0)
MCV: 89.3 fL (ref 80.0–100.0)
Platelets: 225 10*3/uL (ref 150–400)
RBC: 5.15 MIL/uL (ref 4.22–5.81)
RDW: 14.1 % (ref 11.5–15.5)
WBC: 6.6 10*3/uL (ref 4.0–10.5)
nRBC: 0 % (ref 0.0–0.2)

## 2019-02-26 LAB — SURGICAL PCR SCREEN
MRSA, PCR: NEGATIVE
Staphylococcus aureus: NEGATIVE

## 2019-02-26 NOTE — Progress Notes (Signed)
PCP - Mackie Pai, PA-C Cardiologist - [name], PA-C  PPM/ICD - Denies   Chest x-ray - 02/26/2019 EKG - 02/26/2019 Stress Test - Denies ECHO - 08/03/2018 Cardiac Cath - Denies  Sleep Study - Denies  Fasting Blood Sugar - 114-135 Checks Blood Sugar one time a day  Blood Thinner Instructions: Per patient, stop Eliquis 3 days prior to sx. Last dose should be 03/02/2019. Aspirin Instructions:  ERAS Protcol - Yes PRE-SURGERY Ensure or G2- G2  COVID TEST- 03/01/2019   Anesthesia review: No  Patient denies shortness of breath, fever, cough and chest pain at PAT appointment  Coronavirus Screening  Have you experienced the following symptoms:  Cough yes/no: No Fever (>100.15F)  yes/no: No Runny nose yes/no: No Sore throat yes/no: No Difficulty breathing/shortness of breath  yes/no: No  Have you or a family member traveled in the last 14 days and where? yes/no: No   If the patient indicates "YES" to the above questions, their PAT will be rescheduled to limit the exposure to others and, the surgeon will be notified. THE PATIENT WILL NEED TO BE ASYMPTOMATIC FOR 14 DAYS.   If the patient is not experiencing any of these symptoms, the PAT nurse will instruct them to NOT bring anyone with them to their appointment since they may have these symptoms or traveled as well.   Please remind your patients and families that hospital visitation restrictions are in effect and the importance of the restrictions.    All instructions explained to the patient, with a verbal understanding of the material. Patient agrees to go over the instructions while at home for a better understanding. Patient also instructed to self quarantine after being tested for COVID-19. The opportunity to ask questions was provided.

## 2019-02-27 ENCOUNTER — Encounter (HOSPITAL_COMMUNITY): Payer: Self-pay | Admitting: Physician Assistant

## 2019-02-27 NOTE — Progress Notes (Signed)
Anesthesia Chart Review: Follows with cardiology for aflutter that was noted during hospitalization in May for DKA. He converted to sinus rhythm on day of discharge. Preoperative cardiology input outlined on 11/26/18 by [name], PA-C: "Pre-op cardiovascular examination - He has no high risk symptoms to suggest angina or ischemia. Therefore,based on ACC/AHA guidelines, Jonathan L Clappwould be at acceptable risk for the planned procedure without further cardiovascular testing. PharmD reviewed his chart and states, "Per office protocol, patient can holdEliquisfor 3days prior to procedure."We typically advise that blood thinners be resumed when felt safe by performing physician."  Preop labs reviewed, WNL. Last A1c 6.3 on 10/26.  EKG: 11/27/18: SB at 53 bpm  Echo 08/03/18: IMPRESSIONS 1. The left ventricle has low normal systolic function, with an ejection fraction of 50-55%. The cavity size was normal. There is mild concentric left ventricular hypertrophy. Left ventricular diastolic function could not be evaluated secondary to  atrial flutter. 2. The right ventricle has normal systolic function. The cavity was normal. There is no increase in right ventricular wall thickness. 3. The aortic valve was not well visualized. 4. There is a pacer wire in the RV cavity. - There was a erroneous mention of pacer maker in RV that is incorrect. No pacer is noted in the RV. [name] Electronically Amended 11/25/2018, 12:06 PM   Karoline Caldwell, PA-C [facility] Short Stay Center/Anesthesiology Phone [phone] 02/27/2019 4:36 PM

## 2019-02-27 NOTE — Anesthesia Preprocedure Evaluation (Deleted)
Anesthesia Evaluation    Airway        Dental   Pulmonary           Cardiovascular hypertension,      Neuro/Psych    GI/Hepatic   Endo/Other  diabetes  Renal/GU      Musculoskeletal   Abdominal   Peds  Hematology   Anesthesia Other Findings   Reproductive/Obstetrics                             Anesthesia Physical Anesthesia Plan  ASA:   Anesthesia Plan:    Post-op Pain Management:    Induction:   PONV Risk Score and Plan:   Airway Management Planned:   Additional Equipment:   Intra-op Plan:   Post-operative Plan:   Informed Consent:   Plan Discussed with:   Anesthesia Plan Comments: (Follows with cardiology for aflutter that was noted during hospitalization in May for DKA. He converted to sinus rhythm on day of discharge. Preoperative cardiology input outlined on 11/26/18 by [name], PA-C: "Pre-op cardiovascular examination - He has no high risk symptoms to suggest angina or ischemia. Therefore,based on ACC/AHA guidelines, Jonathan L Clappwould be at acceptable risk for the planned procedure without further cardiovascular testing. PharmD reviewed his chart and states, "Per office protocol, patient can holdEliquisfor 3days prior to procedure."We typically advise that blood thinners be resumed when felt safe by performing physician."  Preop labs reviewed, WNL. Last A1c 6.3 on 10/26.  EKG: 11/27/18: SB at 53 bpm  Echo 08/03/18: IMPRESSIONS 1. The left ventricle has low normal systolic function, with an ejection fraction of 50-55%. The cavity size was normal. There is mild concentric left ventricular hypertrophy. Left ventricular diastolic function could not be evaluated secondary to  atrial flutter. 2. The right ventricle has normal systolic function. The cavity was normal. There is no increase in right ventricular wall thickness. 3. The aortic valve was not well  visualized. 4. There is a pacer wire in the RV cavity. - There was a erroneous mention of pacer maker in RV that is incorrect. No pacer is noted in the RV. [name] Electronically Amended 11/25/2018, 12:06 PM)        Anesthesia Quick Evaluation
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
## 2019-03-01 ENCOUNTER — Other Ambulatory Visit (HOSPITAL_COMMUNITY)
Admission: RE | Admit: 2019-03-01 | Discharge: 2019-03-01 | Disposition: A | Discharge status: Home / Self Care | Payer: BC Managed Care – PPO | Source: Ambulatory Visit | Attending: Orthopaedic Surgery | Admitting: Orthopaedic Surgery

## 2019-03-01 DIAGNOSIS — Z01812 Encounter for preprocedural laboratory examination: Secondary | ICD-10-CM | POA: Diagnosis not present

## 2019-03-01 DIAGNOSIS — U071 COVID-19: Secondary | ICD-10-CM | POA: Diagnosis not present

## 2019-03-02 LAB — NOVEL CORONAVIRUS, NAA (HOSP ORDER, SEND-OUT TO REF LAB; TAT 18-24 HRS): SARS-CoV-2, NAA: DETECTED — AB

## 2019-03-05 ENCOUNTER — Encounter (HOSPITAL_COMMUNITY): Admission: RE | Payer: Self-pay | Source: Home / Self Care

## 2019-03-05 ENCOUNTER — Ambulatory Visit (HOSPITAL_COMMUNITY)
Admission: RE | Admit: 2019-03-05 | Payer: BC Managed Care – PPO | Source: Home / Self Care | Admitting: Orthopaedic Surgery

## 2019-03-05 SURGERY — LUMBAR LAMINECTOMY/DECOMPRESSION MICRODISCECTOMY
Anesthesia: General

## 2019-03-06 ENCOUNTER — Telehealth: Payer: Self-pay

## 2019-03-06 NOTE — Telephone Encounter (Signed)
Copied from [place] [id number]. Topic: General - Other >> Mar 06, 2019 10:56 AM Yvette Rack wrote: Reason for CRM: Pt stated he was scheduled for surgery but he tested positive for Covid. Pt stated he was advised by Dr. Lorin Mercy to contact pcp to make him aware. Pt requests call back. Cb# [phone]

## 2019-03-06 NOTE — Telephone Encounter (Signed)
Sorry to hear that he has covid. Would you offer him virtual visit to discuss how he is doing? For Friday or Monday?

## 2019-03-10 ENCOUNTER — Ambulatory Visit (INDEPENDENT_AMBULATORY_CARE_PROVIDER_SITE_OTHER): Payer: BC Managed Care – PPO | Admitting: Medical

## 2019-03-10 ENCOUNTER — Telehealth: Payer: Self-pay | Admitting: Medical

## 2019-03-10 ENCOUNTER — Encounter: Payer: Self-pay | Admitting: Medical

## 2019-03-10 ENCOUNTER — Other Ambulatory Visit: Payer: Self-pay

## 2019-03-10 ENCOUNTER — Other Ambulatory Visit (INDEPENDENT_AMBULATORY_CARE_PROVIDER_SITE_OTHER): Payer: Self-pay | Admitting: Orthopaedic Surgery

## 2019-03-10 VITALS — BP 147/93 | HR 49 | Temp 97.0°F | Ht 74.0 in | Wt 265.0 lb

## 2019-03-10 DIAGNOSIS — U071 COVID-19: Secondary | ICD-10-CM

## 2019-03-10 NOTE — Patient Instructions (Addendum)
You had recent Covid infection with minimal symptoms.  You are approaching about 10 days since testing positive and about 2 weeks since onset of symptoms.  Most of your minimal symptoms are almost completely resolved.  Would recommend an quarantining for additional week and then I feel safe to please quarantine this coming Monday.  Would recommend that you contact your surgeon this coming Tuesday asked to see when he would recommend your back surgery as well as when you should get retested.  Retesting with likely be necessary before potential upcoming surgery.  Follow-up as regular scheduled with myself or as needed.  Note patient blood pressure was elevated and pulse was a little low.  We will going to assess staff to get patient to check her blood pressure and pulse daily for the next 3 days and my chart message me those readings on Friday.

## 2019-03-10 NOTE — Progress Notes (Signed)
   Subjective:    Patient ID: [name], male    DOB: 02-24-1966, 53 y.o.   MRN: [id number]  HPI  Virtual Visit via Video Note  I connected with [name] on 03/10/19 at 11:20 AM EST by a video enabled telemedicine application and verified that I am speaking with the correct person using two identifiers.  Location: Patient: home Provider: office   I discussed the limitations of evaluation and management by telemedicine and the availability of in person appointments. The patient expressed understanding and agreed to proceed.  History of Present Illness:  Pt states he tested + on Mar 01, 2019 for covid. He only had nasal congestion with decrease in taste and smell. He was being tested prior to back surgery then got symptoms. He is almost 10 days post positive test. 3-4 days before tested was very fatigue for one day.  Pt states nasal congestion is very residual now and taste/smell returning.  Pt has missed work since last February due to back issues.      Observations/Objective:General-no acute distress, pleasant, oriented. Lungs- on inspection lungs appear unlabored. Neck- no tracheal deviation or jvd on inspection. Neuro- gross motor function appears intact.  Assessment and Plan:   Follow Up Instructions:    I discussed the assessment and treatment plan with the patient. The patient was provided an opportunity to ask questions and all were answered. The patient agreed with the plan and demonstrated an understanding of the instructions.   The patient was advised to call back or seek an in-person evaluation if the symptoms worsen or if the condition fails to improve as anticipated.  I provided 25  minutes of non-face-to-face time during this encounter.   Mackie Pai, PA-C   Review of Systems     Objective:   Physical Exam        Assessment & Plan:

## 2019-03-10 NOTE — Telephone Encounter (Signed)
Patient blood pressure was a little bit elevated and his pulse was 49 today.  We will do asked patient to check her blood pressure and pulse daily over the next 3 days.  Then MyChart message me those readings on this friday as I expect they will be better but want to verify.

## 2019-03-11 NOTE — Telephone Encounter (Signed)
Left message on machine to call back  

## 2019-03-12 ENCOUNTER — Inpatient Hospital Stay: Payer: BC Managed Care – PPO | Admitting: Orthopaedic Surgery

## 2019-03-12 NOTE — Telephone Encounter (Signed)
Patient will call back on Monday since we will be closed on Friday and he does not use mychart, with his numbers.

## 2019-03-17 ENCOUNTER — Telehealth: Payer: Self-pay

## 2019-03-17 MED ORDER — AMLODIPINE BESYLATE 2.5 MG PO TABS: 2.5000 mg | ORAL_TABLET | Freq: Every day | ORAL | 3 refills | Status: DC

## 2019-03-17 NOTE — Telephone Encounter (Signed)
Spoke to patient and informed.

## 2019-03-17 NOTE — Telephone Encounter (Signed)
Called patient back regarding vitals and he stated that his vitals are as follow: Friday:bp 136/85, hr 65 glucose 120 (fasting) Saturday:bp147/93, hr 49 glucose 94 (fasting) Sunday: bp147/92, hr 60 glucose 93 (fasting) Monday: bp136/86, hr 85 glucose 109 (fasting)       Copied from [place] #[id number]. Topic: General - Other >> Mar 17, 2019  9:50 AM Keene Breath wrote: Reason for CRM: Patient called to give his vital readings to the nurse.  CB# [phone]

## 2019-03-17 NOTE — Telephone Encounter (Signed)
50% of those reading high and other half borderline. So added amlodipine to bp regimen. Recommend follow up one month virtual visit.

## 2019-03-19 ENCOUNTER — Telehealth: Payer: Self-pay | Admitting: Orthopaedic Surgery

## 2019-03-19 NOTE — Telephone Encounter (Signed)
Please advise 

## 2019-03-19 NOTE — Telephone Encounter (Signed)
Ok to [employer] at [employer] after new year day. Needs to be on day other pt goes home since he will be an overnight . thanks

## 2019-03-19 NOTE — Telephone Encounter (Signed)
Patient called. He was scheduled for surgery on December 16th but had to wait because he tested positive for COVID. He needs to speak with [name] to determine what needs to be done on his behalf to proceed.  Call back number: [phone].

## 2019-04-08 ENCOUNTER — Other Ambulatory Visit: Payer: Self-pay | Admitting: *Deleted

## 2019-04-08 MED ORDER — APIXABAN 5 MG PO TABS: 5.0000 mg | ORAL_TABLET | Freq: Two times a day (BID) | ORAL | 5 refills | Status: DC

## 2019-04-08 NOTE — Telephone Encounter (Signed)
Prescription refill request for Eliquis received.   Last office visit: 11/26/2018, [place] Scr: 1.25, 02/26/2019 Age: 54 y.o. Weight: 120.2 kg   Prescription refill sent.

## 2019-04-17 ENCOUNTER — Encounter: Payer: Self-pay | Admitting: Medical

## 2019-04-17 ENCOUNTER — Ambulatory Visit (INDEPENDENT_AMBULATORY_CARE_PROVIDER_SITE_OTHER): Payer: Self-pay | Admitting: Medical

## 2019-04-17 ENCOUNTER — Other Ambulatory Visit: Payer: Self-pay

## 2019-04-17 VITALS — BP 140/93 | HR 63 | Temp 97.2°F | Ht 74.0 in | Wt 270.0 lb

## 2019-04-17 DIAGNOSIS — I10 Essential (primary) hypertension: Secondary | ICD-10-CM

## 2019-04-17 DIAGNOSIS — M5136 Other intervertebral disc degeneration, lumbar region: Secondary | ICD-10-CM

## 2019-04-17 DIAGNOSIS — I4891 Unspecified atrial fibrillation: Secondary | ICD-10-CM

## 2019-04-17 DIAGNOSIS — E1169 Type 2 diabetes mellitus with other specified complication: Secondary | ICD-10-CM

## 2019-04-17 DIAGNOSIS — M5126 Other intervertebral disc displacement, lumbar region: Secondary | ICD-10-CM

## 2019-04-17 MED ORDER — AMLODIPINE BESYLATE 5 MG PO TABS: 5.0000 mg | ORAL_TABLET | Freq: Every day | ORAL | 3 refills | Status: DC

## 2019-04-17 MED ORDER — METOPROLOL TARTRATE 50 MG PO TABS: 50.0000 mg | ORAL_TABLET | Freq: Two times a day (BID) | ORAL | 3 refills | Status: DC

## 2019-04-17 MED ORDER — GLIPIZIDE 5 MG PO TABS: 5.0000 mg | ORAL_TABLET | Freq: Two times a day (BID) | ORAL | 3 refills | Status: DC

## 2019-04-17 MED ORDER — LISINOPRIL 20 MG PO TABS: 20.0000 mg | ORAL_TABLET | Freq: Every day | ORAL | 1 refills | Status: DC

## 2019-04-17 NOTE — Progress Notes (Signed)
   Subjective:    Patient ID: [name], male    DOB: 06-22-1965, 54 y.o.   MRN: [id number]  HPI   Virtual Visit via Telephone Note  I connected with [name] on 04/17/19 at 11:20 AM EST by telephone and verified that I am speaking with the correct person using two identifiers.  Location: Patient: home Provider: office   I discussed the limitations, risks, security and privacy concerns of performing an evaluation and management service by telephone and the availability of in person appointments. I also discussed with the patient that there may be a patient responsible charge related to this service. The patient expressed understanding and agreed to proceed.   History of Present Illness: Pt has hx of low back pain. Pain has been moderate to severe and his back surgery was cancelled.   Pt back pain started in past related to work per pt.  Pt bp is borderline today. Pt is on amlodipine, metropolol and lisinopril   Pt has diabetes and last a1c was 6.2.  Pt is applying for insurance. When get he will let me know.    Observations/Objective: General- no acute distress. Pleasant, alert and oriented. Normal speech.  Assessment and Plan:  Htn- borderline high today and has been the case per pt. So increased amlodipine to 5 mg daily. Refilled other bp meds the same.  For diabetes, last a1c improved/good. Advised low sugar diet and exercise. Refilled diabetic med.  For high cholesterol, continue statin.  When get new insurance let me know will put in future cmp, lipid panel and a1c.  Conservative measures for back pain. Hopefully surgeries will restart soon?  Follow up 3 months or as needed Follow Up Instructions:    I discussed the assessment and treatment plan with the patient. The patient was provided an opportunity to ask questions and all were answered. The patient agreed with the plan and demonstrated an understanding of the instructions.   The patient was  advised to call back or seek an in-person evaluation if the symptoms worsen or if the condition fails to improve as anticipated.  I provided 20 minutes of non-face-to-face time during this encounter.   Esperanza Richters, PA-C    Review of Systems     Objective:   Physical Exam        Assessment & Plan:

## 2019-04-18 ENCOUNTER — Ambulatory Visit: Admit: 2019-04-18 | Payer: BC Managed Care – PPO | Admitting: Orthopaedic Surgery

## 2019-04-18 SURGERY — LUMBAR LAMINECTOMY/DECOMPRESSION MICRODISCECTOMY
Anesthesia: General

## 2019-04-18 NOTE — Patient Instructions (Addendum)
Htn- borderline high today and has been the case per pt. So increased amlodipine to 5 mg daily. Refilled other bp meds the same.  For diabetes, last a1c improved/good. Advised low sugar diet and exercise. Refilled diabetic med.  For high cholesterol, continue statin.  When get new insurance let me know will put in future cmp, lipid panel and a1c.  Conservative measures for back pain. Hopefully surgeries will restart soon?  Follow up 3 months or as needed

## 2019-05-05 ENCOUNTER — Telehealth: Payer: Self-pay | Admitting: *Deleted

## 2019-05-05 NOTE — Telephone Encounter (Signed)
   [facility] Pre-operative Risk Assessment    Request for surgical clearance:  1. What type of surgery is being performed? LUMBAR MICRODISCECTOMY   2. When is this surgery scheduled? TBD   3. What type of clearance is required (medical clearance vs. Pharmacy clearance to hold med vs. Both)? BOTH  4. Are there any medications that need to be held prior to surgery and how long? ELIQUIS   5. Practice name and name of physician performing surgery? [facility]; DR. [place]   6. What is your office phone number [phone]    7.   What is your office fax number [phone] ATTN: [email or web address]  8.   Anesthesia type (None, local, MAC, general) ? CALLED OFFICE TO CONFIRM ANESTHESIA, THOUGH OFFICE CLOSED FOR LUNCH.    [name] 05/05/2019, 11:31 AM  _________________________________________________________________   (provider comments below)

## 2019-05-05 NOTE — Telephone Encounter (Signed)
Left message to call back.  Corine Shelter PA-C 05/05/2019 4:42 PM

## 2019-05-05 NOTE — Telephone Encounter (Addendum)
Pt takes Eliquis for afib with CHADS2VASc score of 2 (HTN, DM). SCr 1.25, CrCl normal when adjusted for body weight.   Recommend holding Eliquis for 3 days prior to spinal procedure per protocol.

## 2019-05-08 ENCOUNTER — Emergency Department (HOSPITAL_BASED_OUTPATIENT_CLINIC_OR_DEPARTMENT_OTHER)
Admission: EM | Admit: 2019-05-08 | Discharge: 2019-05-08 | Disposition: A | Discharge status: Home / Self Care | Payer: Self-pay | Attending: Emergency Medicine | Admitting: Emergency Medicine

## 2019-05-08 ENCOUNTER — Emergency Department (HOSPITAL_BASED_OUTPATIENT_CLINIC_OR_DEPARTMENT_OTHER): Payer: Self-pay

## 2019-05-08 ENCOUNTER — Encounter (HOSPITAL_BASED_OUTPATIENT_CLINIC_OR_DEPARTMENT_OTHER): Payer: Self-pay | Admitting: *Deleted

## 2019-05-08 ENCOUNTER — Other Ambulatory Visit: Payer: Self-pay

## 2019-05-08 DIAGNOSIS — F17220 Nicotine dependence, chewing tobacco, uncomplicated: Secondary | ICD-10-CM | POA: Insufficient documentation

## 2019-05-08 DIAGNOSIS — Z760 Encounter for issue of repeat prescription: Secondary | ICD-10-CM | POA: Insufficient documentation

## 2019-05-08 DIAGNOSIS — M7989 Other specified soft tissue disorders: Secondary | ICD-10-CM | POA: Diagnosis not present

## 2019-05-08 DIAGNOSIS — M25562 Pain in left knee: Secondary | ICD-10-CM | POA: Diagnosis not present

## 2019-05-08 DIAGNOSIS — Z7984 Long term (current) use of oral hypoglycemic drugs: Secondary | ICD-10-CM | POA: Insufficient documentation

## 2019-05-08 DIAGNOSIS — E119 Type 2 diabetes mellitus without complications: Secondary | ICD-10-CM | POA: Insufficient documentation

## 2019-05-08 DIAGNOSIS — R600 Localized edema: Secondary | ICD-10-CM | POA: Diagnosis not present

## 2019-05-08 DIAGNOSIS — M109 Gout, unspecified: Secondary | ICD-10-CM | POA: Insufficient documentation

## 2019-05-08 DIAGNOSIS — I10 Essential (primary) hypertension: Secondary | ICD-10-CM | POA: Insufficient documentation

## 2019-05-08 DIAGNOSIS — Z7901 Long term (current) use of anticoagulants: Secondary | ICD-10-CM | POA: Insufficient documentation

## 2019-05-08 DIAGNOSIS — Z79899 Other long term (current) drug therapy: Secondary | ICD-10-CM | POA: Insufficient documentation

## 2019-05-08 LAB — CBC WITH DIFFERENTIAL/PLATELET
Abs Immature Granulocytes: 0.02 10*3/uL (ref 0.00–0.07)
Basophils Absolute: 0 10*3/uL (ref 0.0–0.1)
Basophils Relative: 0 %
Eosinophils Absolute: 0.2 10*3/uL (ref 0.0–0.5)
Eosinophils Relative: 2 %
HCT: 40.9 % (ref 39.0–52.0)
Hemoglobin: 12.9 g/dL — ABNORMAL LOW (ref 13.0–17.0)
Immature Granulocytes: 0 %
Lymphocytes Relative: 34 %
Lymphs Abs: 2.8 10*3/uL (ref 0.7–4.0)
MCH: 27.9 pg (ref 26.0–34.0)
MCHC: 31.5 g/dL (ref 30.0–36.0)
MCV: 88.5 fL (ref 80.0–100.0)
Monocytes Absolute: 0.7 10*3/uL (ref 0.1–1.0)
Monocytes Relative: 8 %
Neutro Abs: 4.6 10*3/uL (ref 1.7–7.7)
Neutrophils Relative %: 56 %
Platelets: 249 10*3/uL (ref 150–400)
RBC: 4.62 MIL/uL (ref 4.22–5.81)
RDW: 13.6 % (ref 11.5–15.5)
WBC: 8.2 10*3/uL (ref 4.0–10.5)
nRBC: 0 % (ref 0.0–0.2)

## 2019-05-08 LAB — BASIC METABOLIC PANEL
Anion gap: 9 (ref 5–15)
BUN: 20 mg/dL (ref 6–20)
CO2: 22 mmol/L (ref 22–32)
Calcium: 8.9 mg/dL (ref 8.9–10.3)
Chloride: 106 mmol/L (ref 98–111)
Creatinine, Ser: 1.28 mg/dL — ABNORMAL HIGH (ref 0.61–1.24)
GFR calc Af Amer: >60 mL/min (ref >60)
GFR calc non Af Amer: >60 mL/min (ref >60)
Glucose, Bld: 92 mg/dL (ref 70–99)
Potassium: 4 mmol/L (ref 3.5–5.1)
Sodium: 137 mmol/L (ref 135–145)

## 2019-05-08 LAB — URIC ACID: Uric Acid, Serum: 7.8 mg/dL (ref 3.7–8.6)

## 2019-05-08 MED ORDER — APIXABAN 5 MG PO TABS: 5.0000 mg | ORAL_TABLET | Freq: Two times a day (BID) | ORAL | 5 refills | Status: DC

## 2019-05-08 MED ORDER — COLCHICINE 0.6 MG PO CAPS: ORAL_CAPSULE | ORAL | 0 refills | Status: DC

## 2019-05-08 MED ORDER — HYDROCODONE-ACETAMINOPHEN 5-325 MG PO TABS: 1.0000 | ORAL_TABLET | ORAL | 0 refills | Status: DC | PRN
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
## 2019-05-08 NOTE — ED Triage Notes (Signed)
Pain in his left leg and right great toe. Hx of gout. He ran out of gout medication per pt.

## 2019-05-09 NOTE — Telephone Encounter (Signed)
Called pt who stated he currently does not have insurance and is in the process of getting Medicaid. He is out of his Eliquis, pt aware that samples have been left downstairs at the office for him. He has also been given the phone number for medication assistance program.

## 2019-05-09 NOTE — Telephone Encounter (Signed)
   Primary Cardiologist: [name], MD  Chart reviewed as part of pre-operative protocol coverage. Patient was contacted 05/09/2019 in reference to pre-operative risk assessment for pending surgery as outlined below.  [name] was last seen on 11/26/2018 by [name], PA-C for preop exam for this same procedure. Unfortunately it has been rescheduled several times since then  Since his last outpatient visit, [name] has done well from a cardiac standpoint. He reports HR's have been well controlled and he has not had any recent palpitations. He struggles with back pain, and more recently a gout flare in his knee which has limited mobility, however he can still easily complete 4 MET's without anginal complaints.  Therefore, based on ACC/AHA guidelines, the patient would be at acceptable risk for the planned procedure without further cardiovascular testing.   Of note, the patient reports he has been out of his eliquis for 2 weeks due to inability to afford this medication. He submitted an application for medicaid coverage 2 weeks ago but has not heard back yet. He attempted to fill a prescription yesterday, however the cost was $500 for a 1 month supply which is unaffordable. I will route this to the Triage pool to see if we can get him some sample and assist with eliquis assistance paperwork.   Per pharmacy recommendation, patient can hold eliquis 3 days prior to his upcoming back surgery. Eliquis should be restarted when he is cleared to do so by his surgeon.   I will route this recommendation to the requesting party via Epic fax function and remove from pre-op pool.  Please call with questions.  Abigail Butts, PA-C 05/09/2019, 11:12 AM

## 2019-05-13 NOTE — Progress Notes (Signed)
Your procedure is scheduled on Wednesday May 21, 2019.  Report to [facility] Main Entrance "A" at 10:30 A.M., and check in at the Admitting office.  Call this number if you have problems the morning of surgery:  [phone]  Call [phone] if you have any questions prior to your surgery date Monday-Friday 8am-4pm    Remember:  Do not eat or drink after midnight the night before your surgery  You may drink clear liquids until 09:30A.M the morning of your surgery.   Clear liquids allowed are: Water, Non-Citrus Juices (without pulp), Carbonated Beverages, Clear Tea, Black Coffee Only, and Gatorade  Please complete your PRE-SURGERY 10 oz WATER that was provided to you by 09:30AM the morning of surgery.  Please, if able, drink it in one setting. DO NOT SIP.     Take these medicines the morning of surgery with A SIP OF WATER: Amlodipine (NORVASC) Colchicine Hydrocodone-acetaminophen (NORCO) - as needed Metoprolol tartrate (LOPRESSOR)   Follow your surgeon's instructions on when to stop Eliquis.  If no instructions were given by your surgeon then you will need to call the office to get those instructions.    7 days prior to surgery STOP taking any Aspirin, Eliquis (unless otherwise instructed by your surgeon), Aleve, Naproxen, Ibuprofen, Motrin, Advil, Goody's, BC's, all herbal medications, fish oil, and all vitamins including tart cherry.   WHAT DO I DO ABOUT MY DIABETES MEDICATION?   . THE MORNING BEFORE SURGERY take glipiZIDE (GLUCOTROL)  . THE NIGHT BEFORE SURGERY - DO NOT take glipiZIDE (GLUCOTROL)       Do not take oral diabetes medicines (pills) the morning of surgery.   HOW TO MANAGE YOUR DIABETES BEFORE AND AFTER SURGERY  Why is it important to control my blood sugar before and after surgery? . Improving blood sugar levels before and after surgery helps healing and can limit problems. . A way of improving blood sugar control is eating a healthy diet by: o   Eating less sugar and carbohydrates o  Increasing activity/exercise o  Talking with your doctor about reaching your blood sugar goals . High blood sugars (greater than 180 mg/dL) can raise your risk of infections and slow your recovery, so you will need to focus on controlling your diabetes during the weeks before surgery. . Make sure that the doctor who takes care of your diabetes knows about your planned surgery including the date and location.  How do I manage my blood sugar before surgery? . Check your blood sugar at least 4 times a day, starting 2 days before surgery, to make sure that the level is not too high or low. . Check your blood sugar the morning of your surgery when you wake up and every 2 hours until you get to the Short Stay unit. o If your blood sugar is less than 70 mg/dL, you will need to treat for low blood sugar: - Do not take insulin. - Treat a low blood sugar (less than 70 mg/dL) with  cup of clear juice (cranberry or apple), 4 glucose tablets, OR glucose gel. - Recheck blood sugar in 15 minutes after treatment (to make sure it is greater than 70 mg/dL). If your blood sugar is not greater than 70 mg/dL on recheck, call [phone] for further instructions. . Report your blood sugar to the short stay nurse when you get to Short Stay.  . If you are admitted to the hospital after surgery: o Your blood sugar will be  checked by the staff and you will probably be given insulin after surgery (instead of oral diabetes medicines) to make sure you have good blood sugar levels. o The goal for blood sugar control after surgery is 80-180 mg/dL.    The Morning of Surgery  Do not wear jewelry, make-up or nail polish.  Do not wear lotions, powders, or perfumes/colognes, or deodorant  Men may shave face and neck.  Do not bring valuables to the hospital.  [facility] is not responsible for any belongings or valuables.  If you are a smoker, DO NOT Smoke 24 hours prior to  surgery  If you wear a CPAP at night please bring your mask the morning of surgery   Remember that you must have someone to transport you home after your surgery, and remain with you for 24 hours if you are discharged the same day.   Please bring cases for contacts, glasses, hearing aids, dentures or bridgework because it cannot be worn into surgery.    Leave your suitcase in the car.  After surgery it may be brought to your room.  For patients admitted to the hospital, discharge time will be determined by your treatment team.  Patients discharged the day of surgery will not be allowed to drive home.    Special instructions:   [place]- Preparing For Surgery  Before surgery, you can play an important role. Because skin is not sterile, your skin needs to be as free of germs as possible. You can reduce the number of germs on your skin by washing with CHG (chlorahexidine gluconate) Soap before surgery.  CHG is an antiseptic cleaner which kills germs and bonds with the skin to continue killing germs even after washing.    Oral Hygiene is also important to reduce your risk of infection.  Remember - BRUSH YOUR TEETH THE MORNING OF SURGERY WITH YOUR REGULAR TOOTHPASTE  Please do not use if you have an allergy to CHG or antibacterial soaps. If your skin becomes reddened/irritated stop using the CHG.  Do not shave (including legs and underarms) for at least 48 hours prior to first CHG shower. It is OK to shave your face.  Please follow these instructions carefully.   1. Shower the NIGHT BEFORE SURGERY and the MORNING OF SURGERY with CHG Soap.   2. If you chose to wash your hair, wash your hair first as usual with your normal shampoo.  3. After you shampoo, rinse your hair and body thoroughly to remove the shampoo.  4. Use CHG as you would any other liquid soap. You can apply CHG directly to the skin and wash gently with a scrungie or a clean washcloth.   5. Apply the CHG Soap to your  body ONLY FROM THE NECK DOWN.  Do not use on open wounds or open sores. Avoid contact with your eyes, ears, mouth and genitals (private parts). Wash Face and genitals (private parts)  with your normal soap.   6. Wash thoroughly, paying special attention to the area where your surgery will be performed.  7. Thoroughly rinse your body with warm water from the neck down.  8. DO NOT shower/wash with your normal soap after using and rinsing off the CHG Soap.  9. Pat yourself dry with a CLEAN TOWEL.  10. Wear CLEAN PAJAMAS to bed the night before surgery, wear comfortable clothes the morning of surgery  11. Place CLEAN SHEETS on your bed the night of your first shower and DO NOT SLEEP  WITH PETS.    Day of Surgery:  Please shower the morning of surgery with the CHG soap Do not apply any deodorants/lotions. Please wear clean clothes to the hospital/surgery center.   Remember to brush your teeth WITH YOUR REGULAR TOOTHPASTE.   Please read over the following fact sheets that you were given.

## 2019-05-14 ENCOUNTER — Encounter (HOSPITAL_COMMUNITY)
Admission: RE | Admit: 2019-05-14 | Discharge: 2019-05-14 | Disposition: A | Discharge status: Home / Self Care | Payer: Self-pay | Source: Ambulatory Visit | Attending: Orthopaedic Surgery | Admitting: Orthopaedic Surgery

## 2019-05-14 ENCOUNTER — Ambulatory Visit (HOSPITAL_COMMUNITY)
Admission: RE | Admit: 2019-05-14 | Discharge: 2019-05-14 | Disposition: A | Discharge status: Home / Self Care | Payer: Self-pay | Source: Ambulatory Visit | Attending: Surgery | Admitting: Surgery

## 2019-05-14 ENCOUNTER — Telehealth: Payer: Self-pay

## 2019-05-14 ENCOUNTER — Ambulatory Visit (INDEPENDENT_AMBULATORY_CARE_PROVIDER_SITE_OTHER): Payer: No Typology Code available for payment source | Admitting: Surgery

## 2019-05-14 ENCOUNTER — Encounter (HOSPITAL_COMMUNITY): Payer: Self-pay

## 2019-05-14 ENCOUNTER — Ambulatory Visit: Payer: Self-pay | Admitting: Surgery

## 2019-05-14 ENCOUNTER — Encounter: Payer: Self-pay | Admitting: Surgery

## 2019-05-14 ENCOUNTER — Other Ambulatory Visit: Payer: Self-pay

## 2019-05-14 VITALS — BP 143/85 | HR 52 | Temp 97.7°F | Ht 74.0 in | Wt 270.0 lb

## 2019-05-14 DIAGNOSIS — Z01818 Encounter for other preprocedural examination: Secondary | ICD-10-CM

## 2019-05-14 DIAGNOSIS — I10 Essential (primary) hypertension: Secondary | ICD-10-CM | POA: Diagnosis not present

## 2019-05-14 DIAGNOSIS — E785 Hyperlipidemia, unspecified: Secondary | ICD-10-CM | POA: Diagnosis not present

## 2019-05-14 DIAGNOSIS — M109 Gout, unspecified: Secondary | ICD-10-CM | POA: Diagnosis not present

## 2019-05-14 DIAGNOSIS — M5116 Intervertebral disc disorders with radiculopathy, lumbar region: Secondary | ICD-10-CM | POA: Diagnosis present

## 2019-05-14 DIAGNOSIS — M4807 Spinal stenosis, lumbosacral region: Secondary | ICD-10-CM

## 2019-05-14 DIAGNOSIS — E119 Type 2 diabetes mellitus without complications: Secondary | ICD-10-CM | POA: Diagnosis not present

## 2019-05-14 DIAGNOSIS — M48062 Spinal stenosis, lumbar region with neurogenic claudication: Secondary | ICD-10-CM | POA: Diagnosis not present

## 2019-05-14 DIAGNOSIS — Z7984 Long term (current) use of oral hypoglycemic drugs: Secondary | ICD-10-CM | POA: Diagnosis not present

## 2019-05-14 DIAGNOSIS — Z7901 Long term (current) use of anticoagulants: Secondary | ICD-10-CM | POA: Diagnosis not present

## 2019-05-14 DIAGNOSIS — Z79899 Other long term (current) drug therapy: Secondary | ICD-10-CM | POA: Diagnosis not present

## 2019-05-14 DIAGNOSIS — I480 Paroxysmal atrial fibrillation: Secondary | ICD-10-CM | POA: Diagnosis not present

## 2019-05-14 LAB — URINALYSIS, ROUTINE W REFLEX MICROSCOPIC
Bilirubin Urine: NEGATIVE
Glucose, UA: NEGATIVE mg/dL
Hgb urine dipstick: NEGATIVE
Ketones, ur: NEGATIVE mg/dL
Leukocytes,Ua: NEGATIVE
Nitrite: NEGATIVE
Protein, ur: NEGATIVE mg/dL
Specific Gravity, Urine: 1.021 (ref 1.005–1.030)
pH: 5 (ref 5.0–8.0)

## 2019-05-14 LAB — CBC
HCT: 42.8 % (ref 39.0–52.0)
Hemoglobin: 13.5 g/dL (ref 13.0–17.0)
MCH: 27.8 pg (ref 26.0–34.0)
MCHC: 31.5 g/dL (ref 30.0–36.0)
MCV: 88.2 fL (ref 80.0–100.0)
Platelets: 289 10*3/uL (ref 150–400)
RBC: 4.85 MIL/uL (ref 4.22–5.81)
RDW: 13.2 % (ref 11.5–15.5)
WBC: 7.7 10*3/uL (ref 4.0–10.5)
nRBC: 0 % (ref 0.0–0.2)

## 2019-05-14 LAB — COMPREHENSIVE METABOLIC PANEL
ALT: 39 U/L (ref 0–44)
AST: 24 U/L (ref 15–41)
Albumin: 4.3 g/dL (ref 3.5–5.0)
Alkaline Phosphatase: 77 U/L (ref 38–126)
Anion gap: 12 (ref 5–15)
BUN: 17 mg/dL (ref 6–20)
CO2: 22 mmol/L (ref 22–32)
Calcium: 9.7 mg/dL (ref 8.9–10.3)
Chloride: 106 mmol/L (ref 98–111)
Creatinine, Ser: 1.2 mg/dL (ref 0.61–1.24)
GFR calc Af Amer: >60 mL/min (ref >60)
GFR calc non Af Amer: >60 mL/min (ref >60)
Glucose, Bld: 95 mg/dL (ref 70–99)
Potassium: 4.1 mmol/L (ref 3.5–5.1)
Sodium: 140 mmol/L (ref 135–145)
Total Bilirubin: 0.6 mg/dL (ref 0.3–1.2)
Total Protein: 7.8 g/dL (ref 6.5–8.1)

## 2019-05-14 LAB — SURGICAL PCR SCREEN
MRSA, PCR: NEGATIVE
Staphylococcus aureus: NEGATIVE

## 2019-05-14 LAB — GLUCOSE, CAPILLARY: Glucose-Capillary: 102 mg/dL — ABNORMAL HIGH (ref 70–99)

## 2019-05-14 NOTE — Telephone Encounter (Signed)
Called and spoke to the patient. He states that someone called him yesterday offering a sooner appt for today. Made patient aware that I am not sure who called as there is nothing documented and that at this time there are no appts available for today. Patient denies having any issues or concerns that warrant a sooner appointment therefore will keep his follow up appointment on 3/8. He will let us know if his Sx change or worsen before then.

## 2019-05-14 NOTE — Progress Notes (Signed)
Your procedure is scheduled on Wednesday May 21, 2019.  Report to [facility] Main Entrance "A" at 10:30 A.M., and check in at the Admitting office.  Call this number if you have problems the morning of surgery:  [phone]  Call [phone] if you have any questions prior to your surgery date Monday-Friday 8am-4pm    Remember:  Do not eat after midnight the night before your surgery  You may drink clear liquids until 09:30A.M the morning of your surgery.   Clear liquids allowed are: Water, Non-Citrus Juices (without pulp), Carbonated Beverages, Clear Tea, Black Coffee Only, and Gatorade  Please complete your PRE-SURGERY 10 oz WATER that was provided to you by 09:30AM the morning of surgery.  Please, if able, drink it in one setting. DO NOT SIP.     Take these medicines the morning of surgery with A SIP OF WATER: Amlodipine (NORVASC) Colchicine Hydrocodone-acetaminophen (NORCO) - as needed Metoprolol tartrate (LOPRESSOR)   Follow your surgeon's instructions on when to stop Eliquis.  If no instructions were given by your surgeon then you will need to call the office to get those instructions.    7 days prior to surgery STOP taking any Aspirin, Eliquis (unless otherwise instructed by your surgeon), Aleve, Naproxen, Ibuprofen, Motrin, Advil, Goody's, BC's, all herbal medications, fish oil, and all vitamins including tart cherry.   WHAT DO I DO ABOUT MY DIABETES MEDICATION?   . THE MORNING BEFORE SURGERY take glipiZIDE (GLUCOTROL)  . THE NIGHT BEFORE SURGERY - DO NOT take glipiZIDE (GLUCOTROL)       Do not take oral diabetes medicines (pills) the morning of surgery.   HOW TO MANAGE YOUR DIABETES BEFORE AND AFTER SURGERY  Why is it important to control my blood sugar before and after surgery? . Improving blood sugar levels before and after surgery helps healing and can limit problems. . A way of improving blood sugar control is eating a healthy diet by: o  Eating  less sugar and carbohydrates o  Increasing activity/exercise o  Talking with your doctor about reaching your blood sugar goals . High blood sugars (greater than 180 mg/dL) can raise your risk of infections and slow your recovery, so you will need to focus on controlling your diabetes during the weeks before surgery. . Make sure that the doctor who takes care of your diabetes knows about your planned surgery including the date and location.  How do I manage my blood sugar before surgery? . Check your blood sugar at least 4 times a day, starting 2 days before surgery, to make sure that the level is not too high or low. . Check your blood sugar the morning of your surgery when you wake up and every 2 hours until you get to the Short Stay unit. o If your blood sugar is less than 70 mg/dL, you will need to treat for low blood sugar: - Do not take insulin. - Treat a low blood sugar (less than 70 mg/dL) with  cup of clear juice (cranberry or apple), 4 glucose tablets, OR glucose gel. - Recheck blood sugar in 15 minutes after treatment (to make sure it is greater than 70 mg/dL). If your blood sugar is not greater than 70 mg/dL on recheck, call [phone] for further instructions. . Report your blood sugar to the short stay nurse when you get to Short Stay.  . If you are admitted to the hospital after surgery: o Your blood sugar will be checked by  the staff and you will probably be given insulin after surgery (instead of oral diabetes medicines) to make sure you have good blood sugar levels. o The goal for blood sugar control after surgery is 80-180 mg/dL.    The Morning of Surgery  Do not wear jewelry, make-up or nail polish.  Do not wear lotions, powders, or perfumes/colognes, or deodorant  Men may shave face and neck.  Do not bring valuables to the hospital.  [facility] is not responsible for any belongings or valuables.  If you are a smoker, DO NOT Smoke 24 hours prior to surgery  If you  wear a CPAP at night please bring your mask the morning of surgery   Remember that you must have someone to transport you home after your surgery, and remain with you for 24 hours if you are discharged the same day.   Please bring cases for contacts, glasses, hearing aids, dentures or bridgework because it cannot be worn into surgery.    Leave your suitcase in the car.  After surgery it may be brought to your room.  For patients admitted to the hospital, discharge time will be determined by your treatment team.  Patients discharged the day of surgery will not be allowed to drive home.    Special instructions:   [place]- Preparing For Surgery  Before surgery, you can play an important role. Because skin is not sterile, your skin needs to be as free of germs as possible. You can reduce the number of germs on your skin by washing with CHG (chlorahexidine gluconate) Soap before surgery.  CHG is an antiseptic cleaner which kills germs and bonds with the skin to continue killing germs even after washing.    Oral Hygiene is also important to reduce your risk of infection.  Remember - BRUSH YOUR TEETH THE MORNING OF SURGERY WITH YOUR REGULAR TOOTHPASTE  Please do not use if you have an allergy to CHG or antibacterial soaps. If your skin becomes reddened/irritated stop using the CHG.  Do not shave (including legs and underarms) for at least 48 hours prior to first CHG shower. It is OK to shave your face.  Please follow these instructions carefully.   1. Shower the NIGHT BEFORE SURGERY and the MORNING OF SURGERY with CHG Soap.   2. If you chose to wash your hair, wash your hair first as usual with your normal shampoo.  3. After you shampoo, rinse your hair and body thoroughly to remove the shampoo.  4. Use CHG as you would any other liquid soap. You can apply CHG directly to the skin and wash gently with a scrungie or a clean washcloth.   5. Apply the CHG Soap to your body ONLY FROM THE  NECK DOWN.  Do not use on open wounds or open sores. Avoid contact with your eyes, ears, mouth and genitals (private parts). Wash Face and genitals (private parts)  with your normal soap.   6. Wash thoroughly, paying special attention to the area where your surgery will be performed.  7. Thoroughly rinse your body with warm water from the neck down.  8. DO NOT shower/wash with your normal soap after using and rinsing off the CHG Soap.  9. Pat yourself dry with a CLEAN TOWEL.  10. Wear CLEAN PAJAMAS to bed the night before surgery, wear comfortable clothes the morning of surgery  11. Place CLEAN SHEETS on your bed the night of your first shower and DO NOT SLEEP WITH PETS.  Day of Surgery:  Please shower the morning of surgery with the CHG soap Do not apply any deodorants/lotions. Please wear clean clothes to the hospital/surgery center.   Remember to brush your teeth WITH YOUR REGULAR TOOTHPASTE.   Please read over the following fact sheets that you were given.

## 2019-05-14 NOTE — Progress Notes (Signed)
Pt with + Covid test 03/01/19. Surgery 05/21/19- within 90 days of positive test, does not need re-test prior to surgery.

## 2019-05-14 NOTE — Progress Notes (Signed)
54 year old black male with history of L4-5 stenosis, back pain and lower extremity radiculopathy comes in for preop evaluation.  Patient states that symptoms unchanged from previous visit.  He is wanting to proceed with L4-5 decompression as scheduled.  Patient has been on the surgery schedule for this previously but has been canceled for different reasons.  May 08, 2019 patient was seen in the [facility] ED for left knee pain and diagnosed with an acute gout flare.  Patient admits that over the weekend he had taken a large dose of ibuprofen which was 2400 mg.  Since Sunday he has had some left sided abdominal pain more in the upper quadrant area.  Denies any bloody stools or bowel changes.  Patient is on Eliquis for A. fib and knows to stop this medication 3 days preop per his cardiologist.  We have received preop cardiac clearance from May 09, 2019.  On exam patient does have some tenderness in the left upper quadrant.  No acute distress.  Gait is somewhat antalgic.  Assessment L4-5 stenosis. Left-sided abdominal pain.  Plan Patient states that he is supposed to see his cardiologist this afternoon.  We already have clearance from them so I am not really sure as to what today's visit is about.  We will see if we can get our surgery scheduler to find this out.  I advised him to discontinue all oral NSAIDs.  We will see if this helps relieve his left-sided abdominal discomfort.  If he is not better over the next day or 2 I advised him to contact his primary care physician to get this evaluated.  If he has any worsening abdominal pain and if there are any signs of bloody stools he should go to the emergency room immediately.  Patient denies history of gastric ulcers or diverticulitis.

## 2019-05-14 NOTE — Telephone Encounter (Signed)
Patient called and stated that he spoke with a nurse yesterday about coming in today at 4:00. Patient has an appointment with Dr. Eldridge Dace on 05/26/19, but states that he spoke with someone yesterday about moving the appointment up. I do not see a note where anyone spoke with patient yesterday and he states that he does not remember the nurses name. Please call patient.

## 2019-05-14 NOTE — Progress Notes (Signed)
   05/14/19 1309  OBSTRUCTIVE SLEEP APNEA  Have you ever been diagnosed with sleep apnea through a sleep study? No  Do you snore loudly (loud enough to be heard through closed doors)?  1  Do you often feel tired, fatigued, or sleepy during the daytime (such as falling asleep during driving or talking to someone)? 0  Has anyone observed you stop breathing during your sleep? 1  Do you have, or are you being treated for high blood pressure? 1  BMI more than 35 kg/m2? 1  Age > 50 (1-yes) 1  Neck circumference greater than:Male 16 inches or larger, Male 17inches or larger? 1  Male Gender (Yes=1) 1  Obstructive Sleep Apnea Score 7
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
## 2019-05-15 LAB — HEMOGLOBIN A1C
Hgb A1c MFr Bld: 6.3 % — ABNORMAL HIGH (ref 4.8–5.6)
Mean Plasma Glucose: 134.11 mg/dL
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
## 2019-05-15 NOTE — Anesthesia Preprocedure Evaluation (Addendum)
Anesthesia Evaluation  Patient identified by MRN, date of birth, ID band Patient awake    Reviewed: Allergy & Precautions, H&P , NPO status , Patient's Chart, lab work & pertinent test results, reviewed documented beta blocker date and time   Airway Mallampati: II  TM Distance: >3 FB Neck ROM: Full    Dental no notable dental hx. (+) Teeth Intact, Dental Advisory Given   Pulmonary neg pulmonary ROS,    Pulmonary exam normal breath sounds clear to auscultation       Cardiovascular hypertension, Pt. on medications and Pt. on home beta blockers + dysrhythmias Atrial Fibrillation  Rhythm:Regular Rate:Bradycardia     Neuro/Psych Depression negative neurological ROS     GI/Hepatic Neg liver ROS, GERD  ,  Endo/Other  diabetes, Type 2, Oral Hypoglycemic Agents  Renal/GU negative Renal ROS  negative genitourinary   Musculoskeletal   Abdominal   Peds  Hematology negative hematology ROS (+)   Anesthesia Other Findings   Reproductive/Obstetrics negative OB ROS                           Anesthesia Physical Anesthesia Plan  ASA: III  Anesthesia Plan: General   Post-op Pain Management:    Induction: Intravenous  PONV Risk Score and Plan: 3 and Ondansetron, Dexamethasone and Midazolam  Airway Management Planned: Oral ETT  Additional Equipment:   Intra-op Plan:   Post-operative Plan: Extubation in OR  Informed Consent: I have reviewed the patients History and Physical, chart, labs and discussed the procedure including the risks, benefits and alternatives for the proposed anesthesia with the patient or authorized representative who has indicated his/her understanding and acceptance.     Dental advisory given  Plan Discussed with: CRNA  Anesthesia Plan Comments: (PAT note written 05/15/2019 by Shonna Chock, PA-C. )       Anesthesia Quick Evaluation

## 2019-05-16 ENCOUNTER — Other Ambulatory Visit: Payer: Self-pay

## 2019-05-17 ENCOUNTER — Other Ambulatory Visit (HOSPITAL_COMMUNITY): Payer: Self-pay

## 2019-05-20 MED ORDER — DEXTROSE 5 % IV SOLN
3.0000 g | INTRAVENOUS | Status: AC
  Administered 2019-05-21: 13:00:00 3 g via INTRAVENOUS
  Filled 2019-05-20: qty 3000
  Filled 2019-05-20: qty 3

## 2019-05-21 ENCOUNTER — Observation Stay (HOSPITAL_COMMUNITY)
Admission: RE | Admit: 2019-05-21 | Discharge: 2019-05-22 | Disposition: A | Discharge status: Home / Self Care | Payer: No Typology Code available for payment source | Attending: Orthopaedic Surgery | Admitting: Orthopaedic Surgery

## 2019-05-21 ENCOUNTER — Other Ambulatory Visit: Payer: Self-pay

## 2019-05-21 ENCOUNTER — Ambulatory Visit (HOSPITAL_COMMUNITY): Payer: No Typology Code available for payment source

## 2019-05-21 ENCOUNTER — Encounter (HOSPITAL_COMMUNITY)
Admission: RE | Disposition: A | Discharge status: Home / Self Care | Payer: Self-pay | Source: Home / Self Care | Attending: Orthopaedic Surgery

## 2019-05-21 ENCOUNTER — Ambulatory Visit (HOSPITAL_COMMUNITY): Payer: No Typology Code available for payment source | Admitting: Vascular Surgery

## 2019-05-21 ENCOUNTER — Encounter (HOSPITAL_COMMUNITY): Payer: Self-pay | Admitting: Orthopaedic Surgery

## 2019-05-21 ENCOUNTER — Inpatient Hospital Stay: Payer: Self-pay | Admitting: Orthopaedic Surgery

## 2019-05-21 DIAGNOSIS — M48061 Spinal stenosis, lumbar region without neurogenic claudication: Secondary | ICD-10-CM | POA: Diagnosis present

## 2019-05-21 DIAGNOSIS — M48062 Spinal stenosis, lumbar region with neurogenic claudication: Secondary | ICD-10-CM | POA: Insufficient documentation

## 2019-05-21 DIAGNOSIS — M5126 Other intervertebral disc displacement, lumbar region: Secondary | ICD-10-CM | POA: Diagnosis present

## 2019-05-21 DIAGNOSIS — E119 Type 2 diabetes mellitus without complications: Secondary | ICD-10-CM | POA: Diagnosis not present

## 2019-05-21 DIAGNOSIS — E785 Hyperlipidemia, unspecified: Secondary | ICD-10-CM | POA: Insufficient documentation

## 2019-05-21 DIAGNOSIS — M109 Gout, unspecified: Secondary | ICD-10-CM | POA: Diagnosis not present

## 2019-05-21 DIAGNOSIS — Z7901 Long term (current) use of anticoagulants: Secondary | ICD-10-CM | POA: Insufficient documentation

## 2019-05-21 DIAGNOSIS — I480 Paroxysmal atrial fibrillation: Secondary | ICD-10-CM | POA: Insufficient documentation

## 2019-05-21 DIAGNOSIS — Z7984 Long term (current) use of oral hypoglycemic drugs: Secondary | ICD-10-CM | POA: Insufficient documentation

## 2019-05-21 DIAGNOSIS — Z79899 Other long term (current) drug therapy: Secondary | ICD-10-CM | POA: Insufficient documentation

## 2019-05-21 DIAGNOSIS — M5116 Intervertebral disc disorders with radiculopathy, lumbar region: Principal | ICD-10-CM | POA: Insufficient documentation

## 2019-05-21 DIAGNOSIS — I10 Essential (primary) hypertension: Secondary | ICD-10-CM | POA: Insufficient documentation

## 2019-05-21 DIAGNOSIS — Z419 Encounter for procedure for purposes other than remedying health state, unspecified: Secondary | ICD-10-CM

## 2019-05-21 HISTORY — PX: LUMBAR LAMINECTOMY/DECOMPRESSION MICRODISCECTOMY: SHX5026

## 2019-05-21 LAB — GLUCOSE, CAPILLARY
Glucose-Capillary: 103 mg/dL — ABNORMAL HIGH (ref 70–99)
Glucose-Capillary: 127 mg/dL — ABNORMAL HIGH (ref 70–99)
Glucose-Capillary: 210 mg/dL — ABNORMAL HIGH (ref 70–99)

## 2019-05-21 SURGERY — LUMBAR LAMINECTOMY/DECOMPRESSION MICRODISCECTOMY
Anesthesia: General

## 2019-05-21 MED ORDER — ONDANSETRON HCL 4 MG/2ML IJ SOLN
INTRAMUSCULAR | Status: DC | PRN
  Administered 2019-05-21: 4 mg via INTRAVENOUS

## 2019-05-21 MED ORDER — 0.9 % SODIUM CHLORIDE (POUR BTL) OPTIME
TOPICAL | Status: DC | PRN
  Administered 2019-05-21: 13:00:00 1000 mL

## 2019-05-21 MED ORDER — CHLORHEXIDINE GLUCONATE 4 % EX LIQD: 60.0000 mL | Freq: Once | CUTANEOUS | Status: DC

## 2019-05-21 MED ORDER — APIXABAN 5 MG PO TABS
5.0000 mg | ORAL_TABLET | Freq: Two times a day (BID) | ORAL | Status: DC
  Administered 2019-05-22: 5 mg via ORAL
  Filled 2019-05-21: qty 1

## 2019-05-21 MED ORDER — PROPOFOL 10 MG/ML IV BOLUS
INTRAVENOUS | Status: AC
  Filled 2019-05-21: qty 20

## 2019-05-21 MED ORDER — ONDANSETRON HCL 4 MG/2ML IJ SOLN
INTRAMUSCULAR | Status: AC
  Filled 2019-05-21: qty 2

## 2019-05-21 MED ORDER — ACETAMINOPHEN 650 MG RE SUPP: 650.0000 mg | RECTAL | Status: DC | PRN

## 2019-05-21 MED ORDER — AMLODIPINE BESYLATE 5 MG PO TABS
5.0000 mg | ORAL_TABLET | Freq: Every day | ORAL | Status: DC
  Administered 2019-05-22: 5 mg via ORAL
  Filled 2019-05-21: qty 1

## 2019-05-21 MED ORDER — CEFAZOLIN SODIUM-DEXTROSE 2-4 GM/100ML-% IV SOLN
2.0000 g | Freq: Three times a day (TID) | INTRAVENOUS | Status: AC
  Administered 2019-05-21 – 2019-05-22 (×2): 2 g via INTRAVENOUS
  Filled 2019-05-21 (×2): qty 100

## 2019-05-21 MED ORDER — HYDROMORPHONE HCL 1 MG/ML IJ SOLN
INTRAMUSCULAR | Status: AC
  Filled 2019-05-21: qty 1

## 2019-05-21 MED ORDER — METHOCARBAMOL 500 MG PO TABS
500.0000 mg | ORAL_TABLET | Freq: Four times a day (QID) | ORAL | Status: DC | PRN
  Administered 2019-05-22: 500 mg via ORAL
  Filled 2019-05-21: qty 1

## 2019-05-21 MED ORDER — MIDAZOLAM HCL 2 MG/2ML IJ SOLN
INTRAMUSCULAR | Status: AC
  Filled 2019-05-21: qty 2

## 2019-05-21 MED ORDER — LIDOCAINE 2% (20 MG/ML) 5 ML SYRINGE
INTRAMUSCULAR | Status: DC | PRN
  Administered 2019-05-21: 60 mg via INTRAVENOUS

## 2019-05-21 MED ORDER — ACETAMINOPHEN 500 MG PO TABS
1000.0000 mg | ORAL_TABLET | Freq: Once | ORAL | Status: AC
  Administered 2019-05-21: 1000 mg via ORAL
  Filled 2019-05-21: qty 2

## 2019-05-21 MED ORDER — ROCURONIUM BROMIDE 10 MG/ML (PF) SYRINGE
PREFILLED_SYRINGE | INTRAVENOUS | Status: AC
  Filled 2019-05-21: qty 10

## 2019-05-21 MED ORDER — METOPROLOL TARTRATE 50 MG PO TABS
50.0000 mg | ORAL_TABLET | Freq: Two times a day (BID) | ORAL | Status: DC
  Administered 2019-05-21 – 2019-05-22 (×2): 50 mg via ORAL
  Filled 2019-05-21 (×2): qty 1

## 2019-05-21 MED ORDER — LISINOPRIL 20 MG PO TABS
20.0000 mg | ORAL_TABLET | Freq: Every day | ORAL | Status: DC
  Administered 2019-05-21 – 2019-05-22 (×2): 20 mg via ORAL
  Filled 2019-05-21 (×2): qty 1

## 2019-05-21 MED ORDER — DEXAMETHASONE SODIUM PHOSPHATE 10 MG/ML IJ SOLN
INTRAMUSCULAR | Status: AC
  Filled 2019-05-21: qty 1

## 2019-05-21 MED ORDER — CEFAZOLIN SODIUM-DEXTROSE 2-4 GM/100ML-% IV SOLN: 2.0000 g | INTRAVENOUS | Status: DC

## 2019-05-21 MED ORDER — SUGAMMADEX SODIUM 200 MG/2ML IV SOLN
INTRAVENOUS | Status: DC | PRN
  Administered 2019-05-21: 200 mg via INTRAVENOUS

## 2019-05-21 MED ORDER — BUPIVACAINE HCL (PF) 0.25 % IJ SOLN
INTRAMUSCULAR | Status: AC
  Filled 2019-05-21: qty 30

## 2019-05-21 MED ORDER — SODIUM CHLORIDE 0.9 % IV SOLN: INTRAVENOUS | Status: DC

## 2019-05-21 MED ORDER — LACTATED RINGERS IV SOLN: INTRAVENOUS | Status: DC

## 2019-05-21 MED ORDER — FENTANYL CITRATE (PF) 100 MCG/2ML IJ SOLN
INTRAMUSCULAR | Status: DC | PRN
  Administered 2019-05-21: 50 ug via INTRAVENOUS
  Administered 2019-05-21: 100 ug via INTRAVENOUS
  Administered 2019-05-21 (×2): 50 ug via INTRAVENOUS

## 2019-05-21 MED ORDER — BUPIVACAINE HCL (PF) 0.25 % IJ SOLN
INTRAMUSCULAR | Status: DC | PRN
  Administered 2019-05-21: 15 mL

## 2019-05-21 MED ORDER — METHOCARBAMOL 1000 MG/10ML IJ SOLN
500.0000 mg | Freq: Four times a day (QID) | INTRAVENOUS | Status: DC | PRN
  Filled 2019-05-21: qty 5

## 2019-05-21 MED ORDER — ROCURONIUM BROMIDE 10 MG/ML (PF) SYRINGE
PREFILLED_SYRINGE | INTRAVENOUS | Status: DC | PRN
  Administered 2019-05-21: 50 mg via INTRAVENOUS
  Administered 2019-05-21: 40 mg via INTRAVENOUS

## 2019-05-21 MED ORDER — THROMBIN 20000 UNITS EX SOLR
CUTANEOUS | Status: AC
  Filled 2019-05-21: qty 20000

## 2019-05-21 MED ORDER — POLYETHYLENE GLYCOL 3350 17 G PO PACK: 17.0000 g | PACK | Freq: Every day | ORAL | Status: DC | PRN

## 2019-05-21 MED ORDER — GLIPIZIDE 5 MG PO TABS
5.0000 mg | ORAL_TABLET | Freq: Two times a day (BID) | ORAL | Status: DC
  Administered 2019-05-21 – 2019-05-22 (×2): 5 mg via ORAL
  Filled 2019-05-21 (×2): qty 1

## 2019-05-21 MED ORDER — MENTHOL 3 MG MT LOZG: 1.0000 | LOZENGE | OROMUCOSAL | Status: DC | PRN

## 2019-05-21 MED ORDER — HEMOSTATIC AGENTS (NO CHARGE) OPTIME
TOPICAL | Status: DC | PRN
  Administered 2019-05-21: 1 via TOPICAL

## 2019-05-21 MED ORDER — MIDAZOLAM HCL 5 MG/5ML IJ SOLN
INTRAMUSCULAR | Status: DC | PRN
  Administered 2019-05-21: 2 mg via INTRAVENOUS

## 2019-05-21 MED ORDER — DEXAMETHASONE SODIUM PHOSPHATE 10 MG/ML IJ SOLN
INTRAMUSCULAR | Status: DC | PRN
  Administered 2019-05-21: 5 mg via INTRAVENOUS

## 2019-05-21 MED ORDER — HYDROMORPHONE HCL 1 MG/ML IJ SOLN
0.2500 mg | INTRAMUSCULAR | Status: DC | PRN
  Administered 2019-05-21 (×2): 0.5 mg via INTRAVENOUS

## 2019-05-21 MED ORDER — HYDROMORPHONE HCL 1 MG/ML IJ SOLN: 1.0000 mg | INTRAMUSCULAR | Status: DC | PRN

## 2019-05-21 MED ORDER — ONDANSETRON HCL 4 MG PO TABS: 4.0000 mg | ORAL_TABLET | Freq: Four times a day (QID) | ORAL | Status: DC | PRN

## 2019-05-21 MED ORDER — PROPOFOL 10 MG/ML IV BOLUS
INTRAVENOUS | Status: DC | PRN
  Administered 2019-05-21: 30 mg via INTRAVENOUS
  Administered 2019-05-21: 150 mg via INTRAVENOUS

## 2019-05-21 MED ORDER — OXYCODONE HCL 5 MG PO TABS
5.0000 mg | ORAL_TABLET | ORAL | Status: DC | PRN
  Administered 2019-05-21 – 2019-05-22 (×4): 10 mg via ORAL
  Filled 2019-05-21 (×4): qty 2

## 2019-05-21 MED ORDER — FENTANYL CITRATE (PF) 250 MCG/5ML IJ SOLN
INTRAMUSCULAR | Status: AC
  Filled 2019-05-21: qty 5

## 2019-05-21 MED ORDER — PHENOL 1.4 % MT LIQD: 1.0000 | OROMUCOSAL | Status: DC | PRN

## 2019-05-21 MED ORDER — INSULIN ASPART 100 UNIT/ML ~~LOC~~ SOLN
0.0000 [IU] | Freq: Three times a day (TID) | SUBCUTANEOUS | Status: DC
  Administered 2019-05-21: 2 [IU] via SUBCUTANEOUS

## 2019-05-21 MED ORDER — ACETAMINOPHEN 325 MG PO TABS: 650.0000 mg | ORAL_TABLET | ORAL | Status: DC | PRN

## 2019-05-21 MED ORDER — ONDANSETRON HCL 4 MG/2ML IJ SOLN: 4.0000 mg | Freq: Four times a day (QID) | INTRAMUSCULAR | Status: DC | PRN

## 2019-05-21 MED ORDER — LIDOCAINE 2% (20 MG/ML) 5 ML SYRINGE
INTRAMUSCULAR | Status: AC
  Filled 2019-05-21: qty 5

## 2019-05-21 SURGICAL SUPPLY — 46 items
BUR ROUND FLUTED 4 SOFT TCH (BURR) ×2 IMPLANT
BUR ROUND FLUTED 4MM SOFT TCH (BURR) ×1
CANISTER SUCT 3000ML PPV (MISCELLANEOUS) ×3 IMPLANT
CLOSURE STERI-STRIP 1/2X4 (GAUZE/BANDAGES/DRESSINGS)
CLSR STERI-STRIP ANTIMIC 1/2X4 (GAUZE/BANDAGES/DRESSINGS) IMPLANT
COVER SURGICAL LIGHT HANDLE (MISCELLANEOUS) ×3 IMPLANT
COVER WAND RF STERILE (DRAPES) IMPLANT
DECANTER SPIKE VIAL GLASS SM (MISCELLANEOUS) ×3 IMPLANT
DRAPE HALF SHEET 40X57 (DRAPES) ×6 IMPLANT
DRAPE MICROSCOPE LEICA (MISCELLANEOUS) ×3 IMPLANT
DRAPE SURG 17X23 STRL (DRAPES) ×3 IMPLANT
DRSG MEPILEX BORDER 4X4 (GAUZE/BANDAGES/DRESSINGS) IMPLANT
DRSG MEPILEX BORDER 4X8 (GAUZE/BANDAGES/DRESSINGS) ×3 IMPLANT
DURAPREP 26ML APPLICATOR (WOUND CARE) ×3 IMPLANT
ELECT BLADE 4.0 EZ CLEAN MEGAD (MISCELLANEOUS) ×3
ELECT REM PT RETURN 9FT ADLT (ELECTROSURGICAL) ×3
ELECTRODE BLDE 4.0 EZ CLN MEGD (MISCELLANEOUS) ×1 IMPLANT
ELECTRODE REM PT RTRN 9FT ADLT (ELECTROSURGICAL) ×1 IMPLANT
GLOVE BIOGEL PI IND STRL 8 (GLOVE) ×2 IMPLANT
GLOVE BIOGEL PI INDICATOR 8 (GLOVE) ×4
GLOVE ORTHO TXT STRL SZ7.5 (GLOVE) ×6 IMPLANT
GOWN STRL REUS W/ TWL LRG LVL3 (GOWN DISPOSABLE) ×1 IMPLANT
GOWN STRL REUS W/ TWL XL LVL3 (GOWN DISPOSABLE) ×1 IMPLANT
GOWN STRL REUS W/TWL 2XL LVL3 (GOWN DISPOSABLE) ×3 IMPLANT
GOWN STRL REUS W/TWL LRG LVL3 (GOWN DISPOSABLE) ×3
GOWN STRL REUS W/TWL XL LVL3 (GOWN DISPOSABLE) ×3
KIT BASIN OR (CUSTOM PROCEDURE TRAY) ×3 IMPLANT
KIT TURNOVER KIT B (KITS) ×3 IMPLANT
MANIFOLD NEPTUNE II (INSTRUMENTS) IMPLANT
NEEDLE HYPO 25GX1X1/2 BEV (NEEDLE) ×3 IMPLANT
NEEDLE SPNL 18GX3.5 QUINCKE PK (NEEDLE) ×6 IMPLANT
NS IRRIG 1000ML POUR BTL (IV SOLUTION) ×3 IMPLANT
PACK LAMINECTOMY ORTHO (CUSTOM PROCEDURE TRAY) ×3 IMPLANT
PAD ARMBOARD 7.5X6 YLW CONV (MISCELLANEOUS) ×6 IMPLANT
PATTIES SURGICAL .5 X.5 (GAUZE/BANDAGES/DRESSINGS) ×3 IMPLANT
PATTIES SURGICAL .75X.75 (GAUZE/BANDAGES/DRESSINGS) IMPLANT
SUT BONE WAX W31G (SUTURE) ×3 IMPLANT
SUT VIC AB 0 CT1 27 (SUTURE)
SUT VIC AB 0 CT1 27XBRD ANBCTR (SUTURE) IMPLANT
SUT VIC AB 1 CTX 36 (SUTURE) ×3
SUT VIC AB 1 CTX36XBRD ANBCTR (SUTURE) ×1 IMPLANT
SUT VIC AB 2-0 CT1 27 (SUTURE) ×3
SUT VIC AB 2-0 CT1 TAPERPNT 27 (SUTURE) ×1 IMPLANT
SUT VIC AB 3-0 X1 27 (SUTURE) ×3 IMPLANT
TOWEL GREEN STERILE (TOWEL DISPOSABLE) ×3 IMPLANT
TOWEL GREEN STERILE FF (TOWEL DISPOSABLE) ×3 IMPLANT

## 2019-05-21 NOTE — Op Note (Signed)
Preop diagnosis: L4-5 severe multifactorial spinal stenosis with left paracentral disc protrusion.  Postop diagnosis: Same  Procedure: L4 laminectomy central decompression for L4-5 stenosis.  Additional left L4-5 microdiscectomy was required for disc protrusion with nerve root compression.  Surgeon: Annell Greening, MD  Assistant: Zonia Kief, PA-C medically necessary and present for the entire procedure  Anesthesia: General plus Marcaine skin local  EBL: Approximately 300 cc  Procedure: After induction general anesthesia patient placed prone on chest rolls with foam head holder.  Careful padding positioning arms at 9090 rolled yellow foam pads anterior to the shoulders and underneath the ulnar nerve for each arm.  Bumpers were used.  Back was prepped with DuraPrep area squared with towel sterile skin marker used for crosshatch's Betadine Steri-Drape and laminectomy sheet and draped.  Localization with 2 spinal needles were placed.  There were just off slightly  cephalad and removed and repeat image was obtained.  Skin was marked for the needles and timeout procedure been completed.  3 g Ancef had been given prophylactically midline incision was made subperiosteal dissection on the lamina and then repeat x-rays with Coker's for their planned decompression.  Patient had short segment severe stenosis which extended just above and below the disc base requiring near complete laminectomy at L4 removing spinous process thinning the lamina with the bur removing with a Kerrison.  Thick chunks of ligament were removed just on previous laterally bone was removed decompression in the lateral recess right and left.  Left side there was large disc extrusion and annulus was incised and passes were made with micropituitary and straight pituitary.  Lorette Ang was used to push pieces of disc down and delivered until the anterior area where there was protrusion was flat and there was no longer midline and left side compression  of the dura.  Lateral gutters were checked again small remaining area of bone was trimmed back with bone removed at the level of the pedicle and chunks of ligamentum with all been removed which were hypertrophic.  Top portion of L5 had been thinned down with the bur and was removed.  This completed decompression of the area that was compressed on the MRI scan.  Patient had problems with neurogenic claudication symptoms and radicular left leg symptoms.  Irrigation with saline solution.  Some surgical flow was used in the gutters with patties.  Epidural space was dry.  #1 Vicryl in the fascial closure 2 on the subtenons tissue skin staple closure Marcaine infiltration and postop dressing transferred recovery room.
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
## 2019-05-21 NOTE — Anesthesia Procedure Notes (Signed)
Procedure Name: Intubation Date/Time: 05/21/2019 12:40 PM Performed by: Larene Beach, CRNA Pre-anesthesia Checklist: Patient identified, Emergency Drugs available, Suction available and Patient being monitored Patient Re-evaluated:Patient Re-evaluated prior to induction Oxygen Delivery Method: Circle system utilized Preoxygenation: Pre-oxygenation with 100% oxygen Induction Type: IV induction Ventilation: Mask ventilation without difficulty Laryngoscope Size: Mac and 4 Grade View: Grade III Tube type: Oral Tube size: 7.5 mm Number of attempts: 1 Airway Equipment and Method: Stylet and Oral airway Placement Confirmation: ETT inserted through vocal cords under direct vision,  positive ETCO2 and breath sounds checked- equal and bilateral Secured at: 23 cm Tube secured with: Tape Dental Injury: Teeth and Oropharynx as per pre-operative assessment  Comments: Beverlyn Roux SRNA performed intubation

## 2019-05-21 NOTE — Interval H&P Note (Signed)
History and Physical Interval Note:  05/21/2019 12:25 PM  [name]  has presented today for surgery, with the diagnosis of L4-5 disc protrusion with stenosis.  The various methods of treatment have been discussed with the patient and family. After consideration of risks, benefits and other options for treatment, the patient has consented to  Procedure(s): L4-5 LUMBAR LAMINECTOMY/DECOMPRESSION MICRODISCECTOMY (N/A) as a surgical intervention.  The patient's history has been reviewed, patient examined, no change in status, stable for surgery.  I have reviewed the patient's chart and labs.  Questions were answered to the patient's satisfaction.     Eldred Manges

## 2019-05-21 NOTE — Anesthesia Postprocedure Evaluation (Signed)
Anesthesia Post Note  Patient: [name]  Procedure(s) Performed: L4-5 LUMBAR LAMINECTOMY/DECOMPRESSION MICRODISCECTOMY (N/A )     Patient location during evaluation: PACU Anesthesia Type: General Level of consciousness: awake and alert Pain management: pain level controlled Vital Signs Assessment: post-procedure vital signs reviewed and stable Respiratory status: spontaneous breathing, nonlabored ventilation and respiratory function stable Cardiovascular status: blood pressure returned to baseline and stable Postop Assessment: no apparent nausea or vomiting Anesthetic complications: no    Last Vitals:  Vitals:   05/21/19 1059 05/21/19 1500  BP: (!) 154/86 101/88  Pulse: (!) 40 62  Resp:  (!) 22  Temp:  36.4 C  SpO2:  98%    Last Pain:  Vitals:   05/21/19 1500  TempSrc:   PainSc: Asleep                 Yannely Kintzel,W. EDMOND

## 2019-05-21 NOTE — Transfer of Care (Signed)
Immediate Anesthesia Transfer of Care Note  Patient: [name]  Procedure(s) Performed: L4-5 LUMBAR LAMINECTOMY/DECOMPRESSION MICRODISCECTOMY (N/A )  Patient Location: PACU  Anesthesia Type:General  Level of Consciousness: awake, alert  and oriented  Airway & Oxygen Therapy: Patient Spontanous Breathing  Post-op Assessment: Report given to RN and Post -op Vital signs reviewed and stable  Post vital signs: Reviewed and stable  Last Vitals:  Vitals Value Taken Time  BP    Temp    Pulse 62 05/21/19 1459  Resp 22 05/21/19 1459  SpO2 98 % 05/21/19 1459  Vitals shown include unvalidated device data.  Last Pain:  Vitals:   05/21/19 1050  TempSrc:   PainSc: 7          Complications: No apparent anesthesia complications

## 2019-05-22 DIAGNOSIS — M5126 Other intervertebral disc displacement, lumbar region: Secondary | ICD-10-CM | POA: Diagnosis present

## 2019-05-22 DIAGNOSIS — M5116 Intervertebral disc disorders with radiculopathy, lumbar region: Secondary | ICD-10-CM | POA: Diagnosis not present

## 2019-05-22 LAB — GLUCOSE, CAPILLARY
Glucose-Capillary: 105 mg/dL — ABNORMAL HIGH (ref 70–99)
Glucose-Capillary: 120 mg/dL — ABNORMAL HIGH (ref 70–99)
Glucose-Capillary: 67 mg/dL — ABNORMAL LOW (ref 70–99)
Glucose-Capillary: 76 mg/dL (ref 70–99)

## 2019-05-22 MED ORDER — METHOCARBAMOL 500 MG PO TABS: 500.0000 mg | ORAL_TABLET | Freq: Three times a day (TID) | ORAL | 0 refills | Status: DC | PRN

## 2019-05-22 MED ORDER — OXYCODONE-ACETAMINOPHEN 5-325 MG PO TABS: 1.0000 | ORAL_TABLET | Freq: Four times a day (QID) | ORAL | 0 refills | Status: DC | PRN

## 2019-05-22 NOTE — Progress Notes (Signed)
Discharge paperwork and instructions given to pt. Pt not in distress and tolerated well. Mepilex dressing changed.

## 2019-05-22 NOTE — Progress Notes (Addendum)
Pt's blood sugar 67, Dr. Ophelia Charter notified. Orange juice given, lunch ordered, will re-take in 15 minutes. Pt asymptomatic.  Blood sugar was 76 when re-checked, Dr. Ophelia Charter notified.

## 2019-05-22 NOTE — Evaluation (Signed)
Physical Therapy Evaluation Patient Details Name: ANICETO KYSER MRN: [id number] DOB: 10/19/1965 Today's Date: 05/22/2019   History of Present Illness  54 yo admitted for L4-5 lami. PMHx: DM, HTN, HLD, AFib, gout, gERD, depression  Clinical Impression  Pt very pleasant and reports he has a 52yo son and has been out of work since 2020 due to back pain from work injury. Pt painful with all mobility but reported increased comfort once OOB in chair end of session. Pt with decreased strength, transfers, functional mobility and awareness of precautions. Pt will benefit from acute therapy to maximize mobility, safety and independence to decrease burden of care. Pt educated for all precautions with handout provided. Pt encouraged to be OOB for meals and walk at least 3x/day acutely. Will continue to follow and practice stairs prior to D/C.    Follow Up Recommendations Supervision for mobility/OOB;No PT follow up    Equipment Recommendations  3in1 (PT)    Recommendations for Other Services OT consult     Precautions / Restrictions Precautions Precautions: Back Precaution Booklet Issued: Yes (comment) Restrictions Weight Bearing Restrictions: No      Mobility  Bed Mobility Overal bed mobility: Needs Assistance Bed Mobility: Rolling;Sidelying to Sit Rolling: Min guard Sidelying to sit: Min assist       General bed mobility comments: cues for sequence and safety with assist to elevate trunk from surface, increased time  Transfers Overall transfer level: Needs assistance Equipment used: Rolling walker (2 wheeled) Transfers: Sit to/from Stand Sit to Stand: Min guard         General transfer comment: cues for hand placement  Ambulation/Gait Ambulation/Gait assistance: Min guard Gait Distance (Feet): 100 Feet Assistive device: None Gait Pattern/deviations: Step-through pattern;Decreased stride length   Gait velocity interpretation: <1.8 ft/sec, indicate of risk for recurrent  falls General Gait Details: pt with very short step length, very cautious and slow with ridgid guarding trunk with each step. Pt denied attempting RW use but limited by pain  Stairs            Wheelchair Mobility    Modified Rankin (Stroke Patients Only)       Balance Overall balance assessment: Mild deficits observed, not formally tested                                           Pertinent Vitals/Pain Pain Assessment: Faces Faces Pain Scale: Hurts little more Pain Location: back with sit to stand Pain Descriptors / Indicators: Aching;Sore Pain Intervention(s): Limited activity within patient's tolerance;Monitored during session;Premedicated before session;Repositioned    Home Living Family/patient expects to be discharged to:: Private residence Living Arrangements: Spouse/significant other;Children Available Help at Discharge: Family;Available 24 hours/day Type of Home: House Home Access: Level entry     Home Layout: Two level;1/2 bath on main level;Bed/bath upstairs Home Equipment: None      Prior Function Level of Independence: Independent         Comments: fiancee and 54 yo at home     Hand Dominance        Extremity/Trunk Assessment   Upper Extremity Assessment Upper Extremity Assessment: Overall WFL for tasks assessed    Lower Extremity Assessment Lower Extremity Assessment: Overall WFL for tasks assessed    Cervical / Trunk Assessment Cervical / Trunk Assessment: Other exceptions Cervical / Trunk Exceptions: post surgical guarding  Communication   Communication: No difficulties  Cognition Arousal/Alertness: Awake/alert Behavior During Therapy: WFL for tasks assessed/performed Overall Cognitive Status: Within Functional Limits for tasks assessed                                        General Comments      Exercises     Assessment/Plan    PT Assessment Patient needs continued PT services  PT  Problem List Decreased mobility;Decreased activity tolerance;Decreased balance;Decreased knowledge of use of DME;Pain;Decreased knowledge of precautions       PT Treatment Interventions DME instruction;Gait training;Stair training;Functional mobility training;Therapeutic activities;Patient/family education;Therapeutic exercise    PT Goals (Current goals can be found in the Care Plan section)  Acute Rehab PT Goals Patient Stated Goal: be able to walk PT Goal Formulation: With patient Time For Goal Achievement: 05/29/19 Potential to Achieve Goals: Good    Frequency Min 5X/week   Barriers to discharge        Co-evaluation               AM-PAC PT "6 Clicks" Mobility  Outcome Measure Help needed turning from your back to your side while in a flat bed without using bedrails?: A Little Help needed moving from lying on your back to sitting on the side of a flat bed without using bedrails?: A Little Help needed moving to and from a bed to a chair (including a wheelchair)?: A Little Help needed standing up from a chair using your arms (e.g., wheelchair or bedside chair)?: A Little Help needed to walk in hospital room?: A Little Help needed climbing 3-5 steps with a railing? : A Little 6 Click Score: 18    End of Session Equipment Utilized During Treatment: Gait belt Activity Tolerance: Patient tolerated treatment well Patient left: in chair;with call bell/phone within reach Nurse Communication: Mobility status PT Visit Diagnosis: Other abnormalities of gait and mobility (R26.89);Difficulty in walking, not elsewhere classified (R26.2)    Time: 0737-0800 PT Time Calculation (min) (ACUTE ONLY): 23 min   Charges:   PT Evaluation $PT Eval Moderate Complexity: 1 Mod PT Treatments $Therapeutic Activity: 8-22 mins        Slayde Brault P, PT Acute Rehabilitation Services Pager: [phone] Office: [place] 05/22/2019, 12:37 PM

## 2019-05-22 NOTE — Discharge Instructions (Signed)
Your dressing is waterproof you can shower with the dressing on for off with a towel and get dressed.  Walk daily and over the next 1 to 2 weeks progressed to 1 to 2 miles per day.  Walking will help your pain.  See Dr. Ophelia Charter in 1 week.

## 2019-05-22 NOTE — Evaluation (Signed)
Occupational Therapy Evaluation Patient Details Name: [name] MRN: [id number] DOB: 07-14-1965 Today's Date: 05/22/2019    History of Present Illness 54 yo admitted for L4-5 lami. PMHx: DM, HTN, HLD, AFib, gout, gERD, depression   Clinical Impression   Pt was admitted for the above.  At baseline, he had help for LB adls since injury.  He will benefit from 3:1 commode and toilet aide so that he can be independent with his own hygiene.  Family will continue to assist with LB adls as needed. Reviewed precautions during adl activities and pt has a handout. He verbalizes understanding.    Follow Up Recommendations  Supervision/Assistance - 24 hour    Equipment Recommendations  3 in 1 bedside commode(toilet aide)    Recommendations for Other Services       Precautions / Restrictions Precautions Precautions: Back Restrictions Weight Bearing Restrictions: No      Mobility Bed Mobility               General bed mobility comments: oob  Transfers Overall transfer level: Needs assistance Equipment used: Rolling walker (2 wheeled) Transfers: Sit to/from Stand Sit to Stand: Min guard         General transfer comment: for safety, slow transition to standing    Balance                                           ADL either performed or assessed with clinical judgement   ADL Overall ADL's : Needs assistance/impaired Eating/Feeding: Independent   Grooming: Supervision/safety;Standing   Upper Body Bathing: Set up   Lower Body Bathing: Moderate assistance   Upper Body Dressing : Set up   Lower Body Dressing: Maximal assistance   Toilet Transfer: Min guard;Ambulation;RW(chair)   Toileting- Clothing Manipulation and Hygiene: Moderate assistance         General ADL Comments: educated on back precautions and demonstrated tub transfer.  Pt has had help with LB adls prior to this sx.  He will benefit from a toilet aide.  Demonstrated alternative  positions for LB adls, which would follow back precautions.  Pt ambulated a loop around hall when he got up.  Did not want to dress until closer to his d/c time     Vision         Perception     Praxis      Pertinent Vitals/Pain Pain Assessment: Faces Faces Pain Scale: Hurts little more Pain Location: back with sit to stand Pain Descriptors / Indicators: Aching;Sore Pain Intervention(s): Limited activity within patient's tolerance;Monitored during session;Premedicated before session;Repositioned     Hand Dominance     Extremity/Trunk Assessment Upper Extremity Assessment Upper Extremity Assessment: Overall WFL for tasks assessed           Communication Communication Communication: No difficulties   Cognition Arousal/Alertness: Awake/alert Behavior During Therapy: WFL for tasks assessed/performed Overall Cognitive Status: Within Functional Limits for tasks assessed                                     General Comments       Exercises     Shoulder Instructions      Home Living Family/patient expects to be discharged to:: Private residence Living Arrangements: Spouse/significant other Available Help at Discharge: Available 24 hours/day;Family Type of  Home: House Home Access: Level entry     Home Layout: Two level;1/2 bath on main level;Bed/bath upstairs Alternate Level Stairs-Number of Steps: 14   Bathroom Shower/Tub: Teacher, early years/pre: Standard     Home Equipment: None          Prior Functioning/Environment Level of Independence: Independent        Comments: fiancee and 54 yo at home.  His mother and finance's mother also available to help        OT Problem List:        OT Treatment/Interventions:      OT Goals(Current goals can be found in the care plan section) Acute Rehab OT Goals Patient Stated Goal: less pain, return to independence OT Goal Formulation: All assessment and education complete, DC therapy   OT Frequency:     Barriers to D/C:            Co-evaluation              AM-PAC OT "6 Clicks" Daily Activity     Outcome Measure Help from another person eating meals?: None Help from another person taking care of personal grooming?: A Little Help from another person toileting, which includes using toliet, bedpan, or urinal?: A Lot Help from another person bathing (including washing, rinsing, drying)?: A Lot Help from another person to put on and taking off regular upper body clothing?: A Little Help from another person to put on and taking off regular lower body clothing?: A Lot 6 Click Score: 16   End of Session    Activity Tolerance: Patient tolerated treatment well Patient left: in chair;with call bell/phone within reach  OT Visit Diagnosis: Muscle weakness (generalized) (M62.81)                Time: 5102-5852 OT Time Calculation (min): 22 min Charges:  OT General Charges $OT Visit: 1 Visit OT Evaluation $OT Eval Low Complexity: 1 Low  [name] S, OTR/L Acute Rehabilitation Services 05/22/2019  [place] 05/22/2019, 11:09 AM

## 2019-05-22 NOTE — Progress Notes (Signed)
   Subjective: 1 Day Post-Op Procedure(s) (LRB): L4-5 LUMBAR LAMINECTOMY/DECOMPRESSION MICRODISCECTOMY (N/A) Patient reports pain as moderate.    Objective: Vital signs in last 24 hours: Temp:  [97.6 F (36.4 C)-98.3 F (36.8 C)] 97.7 F (36.5 C) (03/04 0304) Pulse Rate:  [40-85] 61 (03/04 0304) Resp:  [13-22] 16 (03/04 0304) BP: (101-173)/(72-88) 128/79 (03/04 0304) SpO2:  [97 %-100 %] 99 % (03/04 0304) Weight:  [122.5 kg] 122.5 kg (03/03 1033)  Intake/Output from previous day: 03/03 0701 - 03/04 0700 In: 2542.2 [P.O.:720; I.V.:1722.2; IV Piggyback:100] Out: 1500 [Urine:1300; Blood:200] Intake/Output this shift: No intake/output data recorded.  No results for input(s): HGB in the last 72 hours. No results for input(s): WBC, RBC, HCT, PLT in the last 72 hours. No results for input(s): NA, K, CL, CO2, BUN, CREATININE, GLUCOSE, CALCIUM in the last 72 hours. No results for input(s): LABPT, INR in the last 72 hours.  Neurologically intact DG Lumbar Spine Complete  Result Date: 05/21/2019 CLINICAL DATA:  Intraoperative localization images. EXAM: LUMBAR SPINE - COMPLETE 4+ VIEW COMPARISON:  MR lumbar spine 01/31/2019. FINDINGS: Four intraoperative cross-table lateral views of the lumbar spine are submitted. Numbering system utilized on 01/31/2019 is preserved. The first image, taken at 1256 hours, shows instrument tips projecting posterior to the L4-5 and L5-S1 facet joints. The second image, taken at 1306 hours, shows instrument tips projecting over the L3 spinous process and posterior to the L4-5 facet joints. The third image, taken at 1307 hours, shows instrument tips projecting posterior to L4 and L5. Finally, the last image, taken at 1404 hours, shows an instrument tip projecting over the L4-5 interspace. IMPRESSION: Intraoperative localization, as above. Electronically Signed   By: Leanna Battles M.D.   On: 05/21/2019 16:21    Assessment/Plan: 1 Day Post-Op Procedure(s)  (LRB): L4-5 LUMBAR LAMINECTOMY/DECOMPRESSION MICRODISCECTOMY (N/A) Plan: discharge home , office one week, dressing change today before discharge.   Eldred Manges 05/22/2019, 8:07 AM

## 2019-05-22 NOTE — Progress Notes (Signed)
Workmen's Comp. Rep./  Adjuster [place] [phone] called by NCM to discuss d/c needs, voice message left. Awaiting call back. Gae Gallop RN,BSN,CM
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
## 2019-05-26 ENCOUNTER — Ambulatory Visit: Payer: BC Managed Care – PPO | Admitting: Interventional Cardiology

## 2019-05-28 ENCOUNTER — Other Ambulatory Visit: Payer: Self-pay

## 2019-05-28 ENCOUNTER — Ambulatory Visit (INDEPENDENT_AMBULATORY_CARE_PROVIDER_SITE_OTHER): Payer: No Typology Code available for payment source | Admitting: Orthopaedic Surgery

## 2019-05-28 ENCOUNTER — Encounter: Payer: Self-pay | Admitting: Orthopaedic Surgery

## 2019-05-28 VITALS — BP 118/83 | HR 59 | Ht 74.0 in | Wt 275.0 lb

## 2019-05-28 DIAGNOSIS — M5126 Other intervertebral disc displacement, lumbar region: Secondary | ICD-10-CM

## 2019-05-28 DIAGNOSIS — M48061 Spinal stenosis, lumbar region without neurogenic claudication: Secondary | ICD-10-CM

## 2019-05-28 DIAGNOSIS — M5136 Other intervertebral disc degeneration, lumbar region: Secondary | ICD-10-CM

## 2019-05-28 DIAGNOSIS — M51369 Other intervertebral disc degeneration, lumbar region without mention of lumbar back pain or lower extremity pain: Secondary | ICD-10-CM

## 2019-05-28 MED ORDER — OXYCODONE-ACETAMINOPHEN 5-325 MG PO TABS: 1.0000 | ORAL_TABLET | Freq: Four times a day (QID) | ORAL | 0 refills | Status: AC | PRN
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
## 2019-06-04 ENCOUNTER — Other Ambulatory Visit: Payer: Self-pay

## 2019-06-04 ENCOUNTER — Encounter: Payer: Self-pay | Admitting: Orthopaedic Surgery

## 2019-06-04 ENCOUNTER — Ambulatory Visit (INDEPENDENT_AMBULATORY_CARE_PROVIDER_SITE_OTHER): Payer: No Typology Code available for payment source | Admitting: Orthopaedic Surgery

## 2019-06-04 VITALS — BP 128/80 | Ht 74.0 in | Wt 270.0 lb

## 2019-06-04 DIAGNOSIS — M5126 Other intervertebral disc displacement, lumbar region: Secondary | ICD-10-CM

## 2019-06-04 DIAGNOSIS — M5136 Other intervertebral disc degeneration, lumbar region: Secondary | ICD-10-CM
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
## 2019-06-24 ENCOUNTER — Ambulatory Visit (INDEPENDENT_AMBULATORY_CARE_PROVIDER_SITE_OTHER): Payer: No Typology Code available for payment source | Admitting: Orthopaedic Surgery

## 2019-06-24 ENCOUNTER — Other Ambulatory Visit: Payer: Self-pay

## 2019-06-24 ENCOUNTER — Encounter: Payer: Self-pay | Admitting: Orthopaedic Surgery

## 2019-06-24 ENCOUNTER — Ambulatory Visit: Payer: Self-pay

## 2019-06-24 VITALS — BP 127/83 | HR 59 | Ht 74.0 in | Wt 270.0 lb

## 2019-06-24 DIAGNOSIS — M542 Cervicalgia: Secondary | ICD-10-CM

## 2019-06-24 DIAGNOSIS — M5126 Other intervertebral disc displacement, lumbar region: Secondary | ICD-10-CM

## 2019-06-24 DIAGNOSIS — M5136 Other intervertebral disc degeneration, lumbar region: Secondary | ICD-10-CM

## 2019-06-24 NOTE — Addendum Note (Signed)
Addended by: Rogers Seeds on: 06/24/2019 04:44 PM   Modules accepted: Orders
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
## 2019-06-30 ENCOUNTER — Ambulatory Visit: Payer: Self-pay | Admitting: Interventional Cardiology

## 2019-06-30 ENCOUNTER — Telehealth: Payer: Self-pay | Admitting: Interventional Cardiology

## 2019-06-30 NOTE — Telephone Encounter (Signed)
New Patient  Patient called and cancelled appt due to no insurance at the moment. Insurance dropped him due to back surgery. Needs to get Eliquis refilled but requested to have samples due to no insurance yet.   Please contact patient to see if samples are available.

## 2019-06-30 NOTE — Telephone Encounter (Signed)
Will still need regular lab checks given that he has had some renal insufficiency in the past.   JV

## 2019-06-30 NOTE — Telephone Encounter (Signed)
Will forward to [place] - pt should be able to qualify for patient assistance through [employer] since he is uninsured. Should be able to provided him with some samples in the mean time while application is being submitted and reviewed.

## 2019-06-30 NOTE — Telephone Encounter (Signed)
Pt states he recently lost his insurance and can no longer afford to get his Eliquis refilled.   He has been out for about 1-2 weeks. He says he did receive a discount/coupon card for the 30 day trial when he first began taking Eliquis.

## 2019-06-30 NOTE — Telephone Encounter (Signed)
Patient rescheduled his f/u for 5/11 and will plan to get labs at that time if needed.

## 2019-06-30 NOTE — Telephone Encounter (Signed)
Called and spoke to patient. Let patient know that I have left 2 week supply of samples for Eliquis downstairs for him to pick up. Also made patient aware that Larita Fife is out of the office today, but gave him the number for [facility] [employer] for him to get started on the process for getting assistance. Patient aware that Larita Fife will follow up with him when she returns to the office. Patient verbalized understanding and appreciated the call.

## 2019-07-01 NOTE — Telephone Encounter (Signed)
**Note De-Identified Zanyia Silbaugh Obfuscation** No ans so I left a detailed message on the pts VM asking him to contact [employer] at [phone] to request that they mail him a pt asst application and to ask any questions he may have concerning him applying for pt asst for his Eliquis.  I also left my name and the office phone number in the message so he can call me if he has any questions for me.

## 2019-07-02 ENCOUNTER — Ambulatory Visit: Payer: Self-pay | Admitting: Orthopaedic Surgery

## 2019-07-03 ENCOUNTER — Encounter (HOSPITAL_BASED_OUTPATIENT_CLINIC_OR_DEPARTMENT_OTHER): Payer: Self-pay | Admitting: Emergency Medicine

## 2019-07-03 ENCOUNTER — Other Ambulatory Visit: Payer: Self-pay

## 2019-07-03 ENCOUNTER — Emergency Department (HOSPITAL_BASED_OUTPATIENT_CLINIC_OR_DEPARTMENT_OTHER)
Admission: EM | Admit: 2019-07-03 | Discharge: 2019-07-03 | Disposition: A | Discharge status: Home / Self Care | Payer: Self-pay | Attending: Emergency Medicine | Admitting: Emergency Medicine

## 2019-07-03 ENCOUNTER — Emergency Department (HOSPITAL_BASED_OUTPATIENT_CLINIC_OR_DEPARTMENT_OTHER): Payer: Self-pay

## 2019-07-03 DIAGNOSIS — Z7901 Long term (current) use of anticoagulants: Secondary | ICD-10-CM | POA: Insufficient documentation

## 2019-07-03 DIAGNOSIS — E119 Type 2 diabetes mellitus without complications: Secondary | ICD-10-CM | POA: Insufficient documentation

## 2019-07-03 DIAGNOSIS — Y998 Other external cause status: Secondary | ICD-10-CM | POA: Insufficient documentation

## 2019-07-03 DIAGNOSIS — I10 Essential (primary) hypertension: Secondary | ICD-10-CM | POA: Insufficient documentation

## 2019-07-03 DIAGNOSIS — S93401A Sprain of unspecified ligament of right ankle, initial encounter: Secondary | ICD-10-CM | POA: Insufficient documentation

## 2019-07-03 DIAGNOSIS — Y9301 Activity, walking, marching and hiking: Secondary | ICD-10-CM | POA: Insufficient documentation

## 2019-07-03 DIAGNOSIS — X509XXA Other and unspecified overexertion or strenuous movements or postures, initial encounter: Secondary | ICD-10-CM | POA: Insufficient documentation

## 2019-07-03 DIAGNOSIS — F17220 Nicotine dependence, chewing tobacco, uncomplicated: Secondary | ICD-10-CM | POA: Insufficient documentation

## 2019-07-03 DIAGNOSIS — Z7984 Long term (current) use of oral hypoglycemic drugs: Secondary | ICD-10-CM | POA: Insufficient documentation

## 2019-07-03 DIAGNOSIS — Z79899 Other long term (current) drug therapy: Secondary | ICD-10-CM | POA: Insufficient documentation

## 2019-07-03 DIAGNOSIS — Y929 Unspecified place or not applicable: Secondary | ICD-10-CM | POA: Insufficient documentation
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
## 2019-07-03 NOTE — ED Triage Notes (Addendum)
Rolled R ankle Tuesday. C/o ongoing pain. Took 2 oxycodone at 8 this morning.

## 2019-07-08 ENCOUNTER — Telehealth: Payer: Self-pay | Admitting: Orthopaedic Surgery

## 2019-07-08 NOTE — Telephone Encounter (Signed)
I called patient and advised. 

## 2019-07-08 NOTE — Telephone Encounter (Signed)
Patient called. Says the PT that is working with him, needs to know what type of surgery was done. His call back number is [phone]

## 2019-07-22 ENCOUNTER — Encounter: Payer: Self-pay | Admitting: Orthopaedic Surgery

## 2019-07-22 ENCOUNTER — Ambulatory Visit (INDEPENDENT_AMBULATORY_CARE_PROVIDER_SITE_OTHER): Payer: No Typology Code available for payment source | Admitting: Orthopaedic Surgery

## 2019-07-22 ENCOUNTER — Other Ambulatory Visit: Payer: Self-pay

## 2019-07-22 DIAGNOSIS — Z9889 Other specified postprocedural states: Secondary | ICD-10-CM
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
## 2019-07-29 ENCOUNTER — Ambulatory Visit: Payer: Self-pay | Admitting: Interventional Cardiology

## 2019-07-30 ENCOUNTER — Telehealth: Payer: Self-pay

## 2019-07-30 DIAGNOSIS — Z9889 Other specified postprocedural states: Secondary | ICD-10-CM

## 2019-07-30 NOTE — Telephone Encounter (Signed)
Victorino Dike with [facility] would like a new Rx faxed for patient to continue PT.  Stated that patient is WC and his insurance requires a new Rx to continue.  Fax# [phone].  CB# [phone].  Please advise.  Thank you.

## 2019-07-30 NOTE — Telephone Encounter (Signed)
Ok for this? 

## 2019-07-31 NOTE — Telephone Encounter (Signed)
Yes  Ok to do  thanks

## 2019-08-01 NOTE — Telephone Encounter (Signed)
NOTED

## 2019-08-01 NOTE — Telephone Encounter (Signed)
Can you please document on referral that I have faxed to [facility]

## 2019-08-01 NOTE — Addendum Note (Signed)
Addended byPrescott Parma on: 08/01/2019 08:24 AM   Modules accepted: Orders

## 2019-08-05 ENCOUNTER — Emergency Department (HOSPITAL_BASED_OUTPATIENT_CLINIC_OR_DEPARTMENT_OTHER): Payer: Self-pay

## 2019-08-05 ENCOUNTER — Other Ambulatory Visit: Payer: Self-pay

## 2019-08-05 ENCOUNTER — Encounter (HOSPITAL_BASED_OUTPATIENT_CLINIC_OR_DEPARTMENT_OTHER): Payer: Self-pay | Admitting: Emergency Medicine

## 2019-08-05 ENCOUNTER — Emergency Department (HOSPITAL_BASED_OUTPATIENT_CLINIC_OR_DEPARTMENT_OTHER)
Admission: EM | Admit: 2019-08-05 | Discharge: 2019-08-05 | Disposition: A | Discharge status: Home / Self Care | Payer: Self-pay | Attending: Emergency Medicine | Admitting: Emergency Medicine

## 2019-08-05 DIAGNOSIS — J069 Acute upper respiratory infection, unspecified: Secondary | ICD-10-CM | POA: Insufficient documentation

## 2019-08-05 DIAGNOSIS — E119 Type 2 diabetes mellitus without complications: Secondary | ICD-10-CM | POA: Insufficient documentation

## 2019-08-05 DIAGNOSIS — Z7984 Long term (current) use of oral hypoglycemic drugs: Secondary | ICD-10-CM | POA: Insufficient documentation

## 2019-08-05 DIAGNOSIS — Z79899 Other long term (current) drug therapy: Secondary | ICD-10-CM | POA: Insufficient documentation

## 2019-08-05 DIAGNOSIS — Z888 Allergy status to other drugs, medicaments and biological substances status: Secondary | ICD-10-CM | POA: Insufficient documentation

## 2019-08-05 DIAGNOSIS — E785 Hyperlipidemia, unspecified: Secondary | ICD-10-CM | POA: Insufficient documentation

## 2019-08-05 DIAGNOSIS — Z20822 Contact with and (suspected) exposure to covid-19: Secondary | ICD-10-CM | POA: Insufficient documentation

## 2019-08-05 DIAGNOSIS — I4891 Unspecified atrial fibrillation: Secondary | ICD-10-CM | POA: Insufficient documentation

## 2019-08-05 DIAGNOSIS — I10 Essential (primary) hypertension: Secondary | ICD-10-CM | POA: Insufficient documentation

## 2019-08-05 LAB — SARS CORONAVIRUS 2 (TAT 6-24 HRS): SARS Coronavirus 2: NEGATIVE

## 2019-08-05 MED ORDER — FLUTICASONE PROPIONATE 50 MCG/ACT NA SUSP: 1.0000 | Freq: Every day | NASAL | 0 refills | Status: DC

## 2019-08-05 MED ORDER — BENZONATATE 100 MG PO CAPS: 100.0000 mg | ORAL_CAPSULE | Freq: Three times a day (TID) | ORAL | 0 refills | Status: DC
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
## 2019-08-05 NOTE — Discharge Instructions (Signed)
Start Flonase for nasal congestion Start Tessalon for cough Continue Mucinex DM for cough and congestion Please follow up with your doctor and return if worsening

## 2019-08-05 NOTE — ED Triage Notes (Signed)
Nasal congestions and cough x 1 week.  No fever or chills.  Some post nasal drip.  Started taking OTC allergy medications since Monday.  Coughing up green mucous.

## 2019-08-13 ENCOUNTER — Other Ambulatory Visit (INDEPENDENT_AMBULATORY_CARE_PROVIDER_SITE_OTHER): Payer: Self-pay | Admitting: Orthopaedic Surgery

## 2019-08-14 NOTE — Telephone Encounter (Signed)
Left voice mail

## 2019-08-25 ENCOUNTER — Telehealth: Payer: Self-pay | Admitting: Orthopaedic Surgery

## 2019-08-25 NOTE — Telephone Encounter (Signed)
Victorino Dike from [facility] called.   She was following up on re-cerification forms that were faxed over on the patient's behalf  Call back: [phone] Fax number: [phone]

## 2019-08-26 NOTE — Telephone Encounter (Signed)
Have you seen forms?

## 2019-08-27 NOTE — Telephone Encounter (Signed)
Just signed 6/8. Will return today

## 2019-08-29 ENCOUNTER — Ambulatory Visit: Payer: Medicaid Other | Admitting: Orthopaedic Surgery

## 2019-09-02 ENCOUNTER — Ambulatory Visit: Payer: Medicaid Other | Admitting: Orthopaedic Surgery

## 2019-09-16 ENCOUNTER — Encounter: Payer: Self-pay | Admitting: Orthopaedic Surgery

## 2019-09-16 ENCOUNTER — Ambulatory Visit (INDEPENDENT_AMBULATORY_CARE_PROVIDER_SITE_OTHER): Payer: No Typology Code available for payment source | Admitting: Orthopaedic Surgery

## 2019-09-16 VITALS — Ht 74.0 in | Wt 270.0 lb

## 2019-09-16 DIAGNOSIS — Z9889 Other specified postprocedural states: Secondary | ICD-10-CM
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
## 2020-06-24 ENCOUNTER — Emergency Department (HOSPITAL_BASED_OUTPATIENT_CLINIC_OR_DEPARTMENT_OTHER): Payer: 59

## 2020-06-24 ENCOUNTER — Encounter (HOSPITAL_BASED_OUTPATIENT_CLINIC_OR_DEPARTMENT_OTHER): Payer: Self-pay

## 2020-06-24 ENCOUNTER — Emergency Department (HOSPITAL_BASED_OUTPATIENT_CLINIC_OR_DEPARTMENT_OTHER)
Admission: EM | Admit: 2020-06-24 | Discharge: 2020-06-24 | Disposition: A | Discharge status: Home / Self Care | Payer: 59 | Attending: Emergency Medicine | Admitting: Emergency Medicine

## 2020-06-24 ENCOUNTER — Other Ambulatory Visit: Payer: Self-pay

## 2020-06-24 DIAGNOSIS — Z7901 Long term (current) use of anticoagulants: Secondary | ICD-10-CM | POA: Diagnosis not present

## 2020-06-24 DIAGNOSIS — E1110 Type 2 diabetes mellitus with ketoacidosis without coma: Secondary | ICD-10-CM | POA: Diagnosis not present

## 2020-06-24 DIAGNOSIS — F17220 Nicotine dependence, chewing tobacco, uncomplicated: Secondary | ICD-10-CM | POA: Diagnosis not present

## 2020-06-24 DIAGNOSIS — I480 Paroxysmal atrial fibrillation: Secondary | ICD-10-CM | POA: Diagnosis not present

## 2020-06-24 DIAGNOSIS — Z7984 Long term (current) use of oral hypoglycemic drugs: Secondary | ICD-10-CM | POA: Diagnosis not present

## 2020-06-24 DIAGNOSIS — I10 Essential (primary) hypertension: Secondary | ICD-10-CM | POA: Insufficient documentation

## 2020-06-24 DIAGNOSIS — Z79899 Other long term (current) drug therapy: Secondary | ICD-10-CM | POA: Insufficient documentation

## 2020-06-24 DIAGNOSIS — K4090 Unilateral inguinal hernia, without obstruction or gangrene, not specified as recurrent: Secondary | ICD-10-CM | POA: Insufficient documentation

## 2020-06-24 DIAGNOSIS — K219 Gastro-esophageal reflux disease without esophagitis: Secondary | ICD-10-CM | POA: Diagnosis not present

## 2020-06-24 DIAGNOSIS — R1030 Lower abdominal pain, unspecified: Secondary | ICD-10-CM | POA: Diagnosis present

## 2020-06-24 LAB — CBC WITH DIFFERENTIAL/PLATELET
Abs Immature Granulocytes: 0.02 10*3/uL (ref 0.00–0.07)
Basophils Absolute: 0 10*3/uL (ref 0.0–0.1)
Basophils Relative: 1 %
Eosinophils Absolute: 0.2 10*3/uL (ref 0.0–0.5)
Eosinophils Relative: 3 %
HCT: 42.1 % (ref 39.0–52.0)
Hemoglobin: 13.4 g/dL (ref 13.0–17.0)
Immature Granulocytes: 0 %
Lymphocytes Relative: 42 %
Lymphs Abs: 3.3 10*3/uL (ref 0.7–4.0)
MCH: 27.6 pg (ref 26.0–34.0)
MCHC: 31.8 g/dL (ref 30.0–36.0)
MCV: 86.8 fL (ref 80.0–100.0)
Monocytes Absolute: 0.5 10*3/uL (ref 0.1–1.0)
Monocytes Relative: 6 %
Neutro Abs: 3.8 10*3/uL (ref 1.7–7.7)
Neutrophils Relative %: 48 %
Platelets: 230 10*3/uL (ref 150–400)
RBC: 4.85 MIL/uL (ref 4.22–5.81)
RDW: 14 % (ref 11.5–15.5)
WBC: 7.9 10*3/uL (ref 4.0–10.5)
nRBC: 0 % (ref 0.0–0.2)

## 2020-06-24 LAB — BASIC METABOLIC PANEL
Anion gap: 9 (ref 5–15)
BUN: 15 mg/dL (ref 6–20)
CO2: 25 mmol/L (ref 22–32)
Calcium: 9 mg/dL (ref 8.9–10.3)
Chloride: 103 mmol/L (ref 98–111)
Creatinine, Ser: 1.3 mg/dL — ABNORMAL HIGH (ref 0.61–1.24)
GFR, Estimated: >60 mL/min (ref >60)
Glucose, Bld: 93 mg/dL (ref 70–99)
Potassium: 4.2 mmol/L (ref 3.5–5.1)
Sodium: 137 mmol/L (ref 135–145)

## 2020-06-24 MED ORDER — IOHEXOL 300 MG/ML  SOLN
100.0000 mL | Freq: Once | INTRAMUSCULAR | Status: AC | PRN
  Administered 2020-06-24: 100 mL via INTRAVENOUS

## 2020-06-24 MED ORDER — HYDROCODONE-ACETAMINOPHEN 5-325 MG PO TABS: 1.0000 | ORAL_TABLET | Freq: Four times a day (QID) | ORAL | 0 refills | Status: DC | PRN

## 2020-06-24 NOTE — ED Triage Notes (Signed)
Pt c/o "hernia-groin" pain x 2 days-points to right groin for pain site-NAD-steady gait

## 2020-06-24 NOTE — ED Notes (Signed)
ED Provider at bedside. 
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
## 2020-06-24 NOTE — Discharge Instructions (Addendum)
As we discussed, your work-up today showed fat-containing inguinal hernias.  This is likely contributing to your pain. Your CT also showed evidence of gallstones.  Additionally, there was some questionable areas of infection in the lung.  As we discussed, this is not contributing to your symptoms today but is something to follow-up with.  As we discussed, we will focus on pain control.  You can take Tylenol or Ibuprofen as directed for pain. You can alternate Tylenol and Ibuprofen every 4 hours. If you take Tylenol at 1pm, then you can take Ibuprofen at 5pm. Then you can take Tylenol again at 9pm.   Take pain medications as directed for break through pain. Do not drive or operate machinery while taking this medication.   Follow-up with referred general surgeon.  Call to make an appointment for further evaluation.  If it anytime, the pain becomes worse, you spike a fever, the area becomes red or hot we have difficulty urinating, return to emergency department immediately.

## 2020-08-23 ENCOUNTER — Other Ambulatory Visit: Payer: Self-pay | Admitting: Medical

## 2020-10-11 ENCOUNTER — Other Ambulatory Visit (HOSPITAL_BASED_OUTPATIENT_CLINIC_OR_DEPARTMENT_OTHER): Payer: Self-pay

## 2020-10-11 ENCOUNTER — Other Ambulatory Visit: Payer: Self-pay

## 2020-10-11 ENCOUNTER — Encounter (HOSPITAL_BASED_OUTPATIENT_CLINIC_OR_DEPARTMENT_OTHER): Payer: Self-pay | Admitting: Urology

## 2020-10-11 ENCOUNTER — Emergency Department (HOSPITAL_BASED_OUTPATIENT_CLINIC_OR_DEPARTMENT_OTHER)
Admission: EM | Admit: 2020-10-11 | Discharge: 2020-10-11 | Disposition: A | Discharge status: Home / Self Care | Payer: 59 | Attending: Emergency Medicine | Admitting: Emergency Medicine

## 2020-10-11 DIAGNOSIS — U071 COVID-19: Secondary | ICD-10-CM | POA: Diagnosis not present

## 2020-10-11 DIAGNOSIS — Z7984 Long term (current) use of oral hypoglycemic drugs: Secondary | ICD-10-CM | POA: Insufficient documentation

## 2020-10-11 DIAGNOSIS — Z79899 Other long term (current) drug therapy: Secondary | ICD-10-CM | POA: Diagnosis not present

## 2020-10-11 DIAGNOSIS — R519 Headache, unspecified: Secondary | ICD-10-CM | POA: Diagnosis present

## 2020-10-11 DIAGNOSIS — I4891 Unspecified atrial fibrillation: Secondary | ICD-10-CM | POA: Diagnosis not present

## 2020-10-11 DIAGNOSIS — I10 Essential (primary) hypertension: Secondary | ICD-10-CM | POA: Diagnosis not present

## 2020-10-11 DIAGNOSIS — Z7901 Long term (current) use of anticoagulants: Secondary | ICD-10-CM | POA: Insufficient documentation

## 2020-10-11 DIAGNOSIS — E1110 Type 2 diabetes mellitus with ketoacidosis without coma: Secondary | ICD-10-CM | POA: Insufficient documentation

## 2020-10-11 LAB — RESP PANEL BY RT-PCR (FLU A&B, COVID) ARPGX2
Influenza A by PCR: NEGATIVE
Influenza B by PCR: NEGATIVE
SARS Coronavirus 2 by RT PCR: POSITIVE — AB

## 2020-10-11 MED ORDER — MOLNUPIRAVIR EUA 200MG CAPSULE
4.0000 | ORAL_CAPSULE | Freq: Two times a day (BID) | ORAL | 0 refills | Status: AC
  Filled 2020-10-11: qty 40, 5d supply, fill #0

## 2020-10-11 NOTE — ED Triage Notes (Signed)
Headache/fever that started last night, denies N/V

## 2020-10-11 NOTE — Discharge Instructions (Signed)
You have tested POSITIVE for COVID 19 today. Please self isolate for the next 7 days. Cleared: 08/01.   Pick up oral antiviral medications and take as prescribed.   Continue taking Tylenol as needed for fevers and body aches.   Follow up with your PCP regarding ED visit and to let them know you have tested positive for COVID 19  Return to the ED IMMEDIATELY for any new/worsening symptoms including severe chest pain, shortness of breath, lips/fingers turning blue, inability to awaken easily, new onset confusion, or any other new/concerning symptoms
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
## 2021-01-11 ENCOUNTER — Emergency Department (HOSPITAL_BASED_OUTPATIENT_CLINIC_OR_DEPARTMENT_OTHER)
Admission: EM | Admit: 2021-01-11 | Discharge: 2021-01-11 | Disposition: A | Discharge status: Home / Self Care | Payer: 59 | Attending: Emergency Medicine | Admitting: Emergency Medicine

## 2021-01-11 ENCOUNTER — Other Ambulatory Visit: Payer: Self-pay

## 2021-01-11 ENCOUNTER — Other Ambulatory Visit: Payer: Self-pay | Admitting: Medical

## 2021-01-11 ENCOUNTER — Encounter (HOSPITAL_BASED_OUTPATIENT_CLINIC_OR_DEPARTMENT_OTHER): Payer: Self-pay | Admitting: Emergency Medicine

## 2021-01-11 DIAGNOSIS — Z7901 Long term (current) use of anticoagulants: Secondary | ICD-10-CM | POA: Diagnosis not present

## 2021-01-11 DIAGNOSIS — R059 Cough, unspecified: Secondary | ICD-10-CM | POA: Insufficient documentation

## 2021-01-11 DIAGNOSIS — E119 Type 2 diabetes mellitus without complications: Secondary | ICD-10-CM | POA: Diagnosis not present

## 2021-01-11 DIAGNOSIS — Z79899 Other long term (current) drug therapy: Secondary | ICD-10-CM | POA: Diagnosis not present

## 2021-01-11 DIAGNOSIS — F17220 Nicotine dependence, chewing tobacco, uncomplicated: Secondary | ICD-10-CM | POA: Diagnosis not present

## 2021-01-11 DIAGNOSIS — J029 Acute pharyngitis, unspecified: Secondary | ICD-10-CM | POA: Diagnosis not present

## 2021-01-11 DIAGNOSIS — I10 Essential (primary) hypertension: Secondary | ICD-10-CM | POA: Diagnosis not present

## 2021-01-11 DIAGNOSIS — I4891 Unspecified atrial fibrillation: Secondary | ICD-10-CM | POA: Insufficient documentation

## 2021-01-11 MED ORDER — AMOXICILLIN 500 MG PO CAPS: 1000.0000 mg | ORAL_CAPSULE | Freq: Two times a day (BID) | ORAL | 0 refills | Status: DC

## 2021-01-11 MED ORDER — LIDOCAINE VISCOUS HCL 2 % MT SOLN: 15.0000 mL | OROMUCOSAL | 0 refills | Status: DC | PRN

## 2021-01-11 NOTE — ED Triage Notes (Signed)
Pt is c/o sore throat and body aches since Friday  Pt has been using lozenges and Robitussin at home without relief

## 2021-01-11 NOTE — ED Notes (Signed)
Discharge instructions discussed with pt. Pt verbalized understanding. Pt stable and ambulatory. No signature pad available. 

## 2021-01-11 NOTE — Discharge Instructions (Addendum)
Thank you for letting us take care of you today!  Please obtain all of your results from medical records or have your doctors office obtain the results - share them with your doctor - you should be seen at your doctors office in the next 2 days. Call today to arrange your follow up. Take the medications as prescribed. Please review all of the medicines and only take them if you do not have an allergy to them. Please be aware that if you are taking birth control pills, taking other prescriptions, ESPECIALLY ANTIBIOTICS may make the birth control ineffective - if this is the case, either do not engage in sexual activity or use alternative methods of birth control such as condoms until you have finished the medicine and your family doctor says it is OK to restart them. If you are on a blood thinner such as COUMADIN, be aware that any other medicine that you take may cause the coumadin to either work too much, or not enough - you should have your coumadin level rechecked in next 7 days if this is the case.  ?  It is also a possibility that you have an allergic reaction to any of the medicines that you have been prescribed - Everybody reacts differently to medications and while MOST people have no trouble with most medicines, you may have a reaction such as nausea, vomiting, rash, swelling, shortness of breath. If this is the case, please stop taking the medicine immediately and contact your physician.   If you were given a medication in the ED such as percocet, vicodin, or morphine, be aware that these medicines are sedating and may change your ability to take care of yourself adequately for several hours after being given this medicines - you should not drive or take care of small children if you were given this medicine in the Emergency Department or if you have been prescribed these types of medicines. ?   You should return to the ER IMMEDIATELY if you develop severe or worsening symptoms.    Amoxicillin  if you are no better in the next 2 days Lidocaine swish and swallow -  Naprosyn for pain  ER for worsening symptoms.
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
## 2021-01-24 ENCOUNTER — Encounter (HOSPITAL_BASED_OUTPATIENT_CLINIC_OR_DEPARTMENT_OTHER): Payer: Self-pay | Admitting: *Deleted

## 2021-01-24 ENCOUNTER — Emergency Department (HOSPITAL_BASED_OUTPATIENT_CLINIC_OR_DEPARTMENT_OTHER): Payer: 59

## 2021-01-24 ENCOUNTER — Other Ambulatory Visit: Payer: Self-pay

## 2021-01-24 ENCOUNTER — Emergency Department (HOSPITAL_BASED_OUTPATIENT_CLINIC_OR_DEPARTMENT_OTHER)
Admission: EM | Admit: 2021-01-24 | Discharge: 2021-01-24 | Disposition: A | Discharge status: Home / Self Care | Payer: 59 | Attending: Emergency Medicine | Admitting: Emergency Medicine

## 2021-01-24 DIAGNOSIS — M109 Gout, unspecified: Secondary | ICD-10-CM | POA: Diagnosis not present

## 2021-01-24 DIAGNOSIS — E119 Type 2 diabetes mellitus without complications: Secondary | ICD-10-CM | POA: Diagnosis not present

## 2021-01-24 DIAGNOSIS — F17220 Nicotine dependence, chewing tobacco, uncomplicated: Secondary | ICD-10-CM | POA: Insufficient documentation

## 2021-01-24 DIAGNOSIS — Z7984 Long term (current) use of oral hypoglycemic drugs: Secondary | ICD-10-CM | POA: Insufficient documentation

## 2021-01-24 DIAGNOSIS — I10 Essential (primary) hypertension: Secondary | ICD-10-CM | POA: Diagnosis not present

## 2021-01-24 DIAGNOSIS — M25561 Pain in right knee: Secondary | ICD-10-CM | POA: Diagnosis present

## 2021-01-24 LAB — URIC ACID: Uric Acid, Serum: 8.2 mg/dL (ref 3.7–8.6)

## 2021-01-24 LAB — CBC WITH DIFFERENTIAL/PLATELET
Abs Immature Granulocytes: 0.04 10*3/uL (ref 0.00–0.07)
Basophils Absolute: 0 10*3/uL (ref 0.0–0.1)
Basophils Relative: 0 %
Eosinophils Absolute: 0.1 10*3/uL (ref 0.0–0.5)
Eosinophils Relative: 1 %
HCT: 39.9 % (ref 39.0–52.0)
Hemoglobin: 12.8 g/dL — ABNORMAL LOW (ref 13.0–17.0)
Immature Granulocytes: 0 %
Lymphocytes Relative: 24 %
Lymphs Abs: 2.7 10*3/uL (ref 0.7–4.0)
MCH: 27.2 pg (ref 26.0–34.0)
MCHC: 32.1 g/dL (ref 30.0–36.0)
MCV: 84.9 fL (ref 80.0–100.0)
Monocytes Absolute: 0.6 10*3/uL (ref 0.1–1.0)
Monocytes Relative: 6 %
Neutro Abs: 7.8 10*3/uL — ABNORMAL HIGH (ref 1.7–7.7)
Neutrophils Relative %: 69 %
Platelets: 292 10*3/uL (ref 150–400)
RBC: 4.7 MIL/uL (ref 4.22–5.81)
RDW: 13.1 % (ref 11.5–15.5)
WBC: 11.3 10*3/uL — ABNORMAL HIGH (ref 4.0–10.5)
nRBC: 0 % (ref 0.0–0.2)

## 2021-01-24 LAB — BASIC METABOLIC PANEL
Anion gap: 7 (ref 5–15)
BUN: 20 mg/dL (ref 6–20)
CO2: 28 mmol/L (ref 22–32)
Calcium: 9.3 mg/dL (ref 8.9–10.3)
Chloride: 102 mmol/L (ref 98–111)
Creatinine, Ser: 1.36 mg/dL — ABNORMAL HIGH (ref 0.61–1.24)
GFR, Estimated: >60 mL/min (ref >60)
Glucose, Bld: 103 mg/dL — ABNORMAL HIGH (ref 70–99)
Potassium: 4.1 mmol/L (ref 3.5–5.1)
Sodium: 137 mmol/L (ref 135–145)

## 2021-01-24 MED ORDER — COLCHICINE 0.6 MG PO TABS: 0.6000 mg | ORAL_TABLET | Freq: Two times a day (BID) | ORAL | 0 refills | Status: DC

## 2021-01-24 MED ORDER — MORPHINE SULFATE (PF) 4 MG/ML IV SOLN
4.0000 mg | Freq: Once | INTRAVENOUS | Status: AC
  Administered 2021-01-24: 4 mg via INTRAVENOUS
  Filled 2021-01-24: qty 1

## 2021-01-24 NOTE — ED Triage Notes (Addendum)
C/o right knee pain x 1 week , denies injury  past hx of gout
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
## 2021-01-24 NOTE — Discharge Instructions (Signed)
Please take colchicine as prescribed to help with gout.  Please stay off your leg.  See orthopedic doctor for follow-up  Return to ER if you have worse knee swelling or pain or fever

## 2021-01-31 ENCOUNTER — Encounter: Payer: Self-pay | Admitting: Medical

## 2021-01-31 ENCOUNTER — Other Ambulatory Visit: Payer: Self-pay

## 2021-01-31 ENCOUNTER — Ambulatory Visit: Payer: 59 | Admitting: Medical

## 2021-01-31 VITALS — BP 150/90 | HR 55 | Ht 74.0 in | Wt 280.0 lb

## 2021-01-31 DIAGNOSIS — I10 Essential (primary) hypertension: Secondary | ICD-10-CM

## 2021-01-31 DIAGNOSIS — R001 Bradycardia, unspecified: Secondary | ICD-10-CM

## 2021-01-31 DIAGNOSIS — E1169 Type 2 diabetes mellitus with other specified complication: Secondary | ICD-10-CM | POA: Diagnosis not present

## 2021-01-31 DIAGNOSIS — I4891 Unspecified atrial fibrillation: Secondary | ICD-10-CM | POA: Diagnosis not present

## 2021-01-31 MED ORDER — GLIPIZIDE 5 MG PO TABS: 5.0000 mg | ORAL_TABLET | Freq: Two times a day (BID) | ORAL | 0 refills | Status: DC

## 2021-01-31 MED ORDER — ATORVASTATIN CALCIUM 10 MG PO TABS: 10.0000 mg | ORAL_TABLET | Freq: Every day | ORAL | 3 refills | Status: DC

## 2021-01-31 MED ORDER — LISINOPRIL 10 MG PO TABS: 10.0000 mg | ORAL_TABLET | Freq: Every day | ORAL | 0 refills | Status: DC
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
## 2021-01-31 NOTE — Patient Instructions (Addendum)
Hypertension-blood pressure 150/90.  Continue metoprolol 50 mg twice daily and adding lisinopril 10 mg daily to treatment.   History of atrial fibrillation.  2 years since he last saw cardiologist.  No palpitations or tachycardia events reported.  You expressed not wanting to be on Eliquis.  Did review your last cardiologist note and they did recommend Eliquis 2 years ago.  Your EKG today showed sinus bradycardia but no atrial fibrillation.  Went ahead and placed referral back to your cardiologist.  Continue beta-blocker.  Hoping that she will be evaluated in a couple weeks.  We will see if they want you to get back on Eliquis.  He might get studies such as Zio patch?.  Diabetes.  Last A1c was well controlled but that was May 14, 2019.  Continue glipizide.  For lipidemia.  Refilling your atorvastatin 10 mg.  Will get lipid panel, CMP and A1c today.  Follow-up in 1 month or sooner if needed

## 2021-02-01 LAB — LIPID PANEL
Cholesterol: 174 mg/dL (ref 0–200)
HDL: 38.1 mg/dL — ABNORMAL LOW (ref >39.00)
LDL Cholesterol: 103 mg/dL — ABNORMAL HIGH (ref 0–99)
NonHDL: 135.98
Total CHOL/HDL Ratio: 5
Triglycerides: 166 mg/dL — ABNORMAL HIGH (ref 0.0–149.0)
VLDL: 33.2 mg/dL (ref 0.0–40.0)

## 2021-02-01 LAB — COMPREHENSIVE METABOLIC PANEL WITH GFR
ALT: 22 U/L (ref 0–53)
AST: 16 U/L (ref 0–37)
Albumin: 4.6 g/dL (ref 3.5–5.2)
Alkaline Phosphatase: 87 U/L (ref 39–117)
BUN: 22 mg/dL (ref 6–23)
CO2: 28 meq/L (ref 19–32)
Calcium: 9.8 mg/dL (ref 8.4–10.5)
Chloride: 100 meq/L (ref 96–112)
Creatinine, Ser: 1.16 mg/dL (ref 0.40–1.50)
GFR: 72.13 mL/min
Glucose, Bld: 80 mg/dL (ref 70–99)
Potassium: 4.4 meq/L (ref 3.5–5.1)
Sodium: 137 meq/L (ref 135–145)
Total Bilirubin: 0.5 mg/dL (ref 0.2–1.2)
Total Protein: 7.6 g/dL (ref 6.0–8.3)

## 2021-02-01 LAB — HEMOGLOBIN A1C: Hgb A1c MFr Bld: 7.2 % — ABNORMAL HIGH (ref 4.6–6.5)

## 2021-02-01 MED ORDER — CANAGLIFLOZIN 100 MG PO TABS: 100.0000 mg | ORAL_TABLET | Freq: Every day | ORAL | 3 refills | Status: DC

## 2021-02-01 NOTE — Addendum Note (Signed)
Addended by: Gwenevere Abbot on: 02/01/2021 07:58 PM   Modules accepted: Orders
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
## 2021-02-07 ENCOUNTER — Telehealth: Payer: Self-pay

## 2021-02-07 ENCOUNTER — Ambulatory Visit: Payer: 59 | Admitting: Interventional Cardiology

## 2021-02-07 NOTE — Telephone Encounter (Signed)
invokana    KeyGunnar Fusi - PA Case ID: [id number]

## 2021-02-08 ENCOUNTER — Telehealth: Payer: Self-pay | Admitting: Medical

## 2021-02-08 MED ORDER — EMPAGLIFLOZIN 10 MG PO TABS: 10.0000 mg | ORAL_TABLET | Freq: Every day | ORAL | 3 refills | Status: DC

## 2021-02-08 NOTE — Telephone Encounter (Signed)
Rx jardiance sent to pt pharmacy.

## 2021-02-08 NOTE — Telephone Encounter (Signed)
Invokana denied , jardiance may be covered

## 2021-03-04 ENCOUNTER — Ambulatory Visit (INDEPENDENT_AMBULATORY_CARE_PROVIDER_SITE_OTHER): Payer: 59 | Admitting: Interventional Cardiology

## 2021-03-04 ENCOUNTER — Encounter: Payer: Self-pay | Admitting: Interventional Cardiology

## 2021-03-04 ENCOUNTER — Other Ambulatory Visit: Payer: Self-pay

## 2021-03-04 VITALS — BP 132/84 | HR 62 | Ht 74.0 in | Wt 283.8 lb

## 2021-03-04 DIAGNOSIS — I480 Paroxysmal atrial fibrillation: Secondary | ICD-10-CM | POA: Diagnosis not present

## 2021-03-04 DIAGNOSIS — I10 Essential (primary) hypertension: Secondary | ICD-10-CM

## 2021-03-04 DIAGNOSIS — E119 Type 2 diabetes mellitus without complications: Secondary | ICD-10-CM
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
## 2021-03-04 NOTE — Patient Instructions (Signed)
Medication Instructions:  Your physician recommends that you continue on your current medications as directed. Please refer to the Current Medication list given to you today.  *If you need a refill on your cardiac medications before your next appointment, please call your pharmacy*   Lab Work: none If you have labs (blood work) drawn today and your tests are completely normal, you will receive your results only by: MyChart Message (if you have MyChart) OR A paper copy in the mail If you have any lab test that is abnormal or we need to change your treatment, we will call you to review the results.   Testing/Procedures: none   Follow-Up: At [facility], you and your health needs are our priority.  As part of our continuing mission to provide you with exceptional heart care, we have created designated Provider Care Teams.  These Care Teams include your primary Cardiologist (physician) and Advanced Practice Providers (APPs -  Physician Assistants and Nurse Practitioners) who all work together to provide you with the care you need, when you need it.  We recommend signing up for the patient portal called "MyChart".  Sign up information is provided on this After Visit Summary.  MyChart is used to connect with patients for Virtual Visits (Telemedicine).  Patients are able to view lab/test results, encounter notes, upcoming appointments, etc.  Non-urgent messages can be sent to your provider as well.   To learn more about what you can do with MyChart, go to [email or web address].    Your next appointment:   12 month(s)  The format for your next appointment:   In Person  Provider:   Lance Muss, MD     Other Instructions You have been referred to Dr Elberta Fortis

## 2021-04-19 ENCOUNTER — Other Ambulatory Visit: Payer: Self-pay

## 2021-04-19 ENCOUNTER — Ambulatory Visit: Payer: 59 | Admitting: Cardiology

## 2021-04-19 ENCOUNTER — Encounter: Payer: Self-pay | Admitting: Cardiology

## 2021-04-19 VITALS — BP 140/84 | HR 48 | Ht 74.0 in | Wt 287.2 lb

## 2021-04-19 DIAGNOSIS — I480 Paroxysmal atrial fibrillation: Secondary | ICD-10-CM

## 2021-04-19 NOTE — Patient Instructions (Signed)
Medication Instructions:  Your physician recommends that you continue on your current medications as directed. Please refer to the Current Medication list given to you today.  *If you need a refill on your cardiac medications before your next appointment, please call your pharmacy*   Lab Work: None ordered   Testing/Procedures: Your physician has recommended that you have a loop recorder inserted.  ** Will precertify loop recorder implant with your insurance.  We will call you to arrange follow up appointment to have the device implanted once we receive approval.   Follow-Up: At [facility], you and your health needs are our priority.  As part of our continuing mission to provide you with exceptional heart care, we have created designated Provider Care Teams.  These Care Teams include your primary Cardiologist (physician) and Advanced Practice Providers (APPs -  Physician Assistants and Nurse Practitioners) who all work together to provide you with the care you need, when you need it.  We recommend signing up for the patient portal called "MyChart".  Sign up information is provided on this After Visit Summary.  MyChart is used to connect with patients for Virtual Visits (Telemedicine).  Patients are able to view lab/test results, encounter notes, upcoming appointments, etc.  Non-urgent messages can be sent to your provider as well.   To learn more about what you can do with MyChart, go to [email or web address].    Your next appointment:   To be  determined  The format for your next appointment:   In Person  Provider:   Loman Brooklyn, MD    Thank you for choosing [facility]!!   Dory Horn, RN [phone]   Other Instructions  Implantable Loop Recorder Placement An implantable loop recorder is a small electronic device that is placed under the skin of your chest. The device records the electrical activity of your heart over a long period of time. Your health care  provider can download these recordings to monitor your heart. You may need an implantable loop recorder if you have periods of abnormal heart activity (arrhythmias) or unexplained fainting (syncope). The recorder can be left in place for 1 year or longer. Tell a health care provider about: Any allergies you have. All medicines you are taking, including vitamins, herbs, eye drops, creams, and over-the-counter medicines. Any problems you or family members have had with anesthetic medicines. Any bleeding problems you have. Any surgeries you have had. Any medical conditions you have. Whether you are pregnant or may be pregnant. What are the risks? Generally, this is a safe procedure. However, problems may occur, including: Infection. Bleeding. Allergic reactions to anesthetic medicines. Damage to nerves or blood vessels. Failure of the device to work. This could require another surgery to replace it. What happens before the procedure?  You may have a physical exam, blood tests, and imaging tests of your heart, such as a chest X-ray. Follow instructions from your health care provider about eating or drinking restrictions. Ask your health care provider about: Changing or stopping your regular medicines. This is especially important if you are taking diabetes medicines or blood thinners. Taking medicines such as aspirin and ibuprofen. These medicines can thin your blood. Do not take these medicines unless your health care provider tells you to take them. Taking over-the-counter medicines, vitamins, herbs, and supplements. Ask your health care provider how your surgical site will be marked or identified. Ask your health care provider what steps will be taken to help prevent infection. These may include: Removing  hair at the surgery site. Washing skin with a germ-killing soap. Plan to have someone take you home from the hospital or clinic. Plan to have a responsible adult care for you for at  least 24 hours after you leave the hospital or clinic. This is important. Do not use any products that contain nicotine or tobacco, such as cigarettes and e-cigarettes. If you need help quitting, ask your health care provider. What happens during the procedure? An IV will be inserted into one of your veins. You may be given one or more of the following: A medicine to help you relax (sedative). A medicine to numb the area (local anesthetic). A small incision will be made on the left side of your upper chest. A pocket will be created under your skin. The device will be placed in the pocket. The incision will be closed with stitches (sutures) or adhesive strips. A bandage (dressing) will be placed over the incision. The procedure may vary among health care providers and hospitals. What happens after the procedure? Your blood pressure, heart rate, breathing rate, and blood oxygen level will be monitored until you leave the hospital or clinic. You may be able to go home on the day of your surgery. Before you go home: Your health care provider will program your recorder. You will learn how to trigger your device with a handheld activator. You will learn how to send recordings to your health care provider. You will get an ID card for your device, and you will be told when to use it. Do not drive for 24 hours if you were given a sedative during your procedure. Summary An implantable loop recorder is a small electronic device that is placed under the skin of your chest to monitor your heart over a long period of time. The recorder can be left in place for 1 year or longer. Plan to have someone take you home from the hospital or clinic. This information is not intended to replace advice given to you by your health care provider. Make sure you discuss any questions you have with your health care provider. Document Revised: 07/06/2020 Document Reviewed: 07/06/2020 Elsevier Patient Education  [employer].
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
## 2021-06-04 IMAGING — MR MR LUMBAR SPINE W/O CM
4 of 5 series · 26 of 48 positions shown · non-contrast
Comparison: MRI 09/09/2017

CLINICAL DATA: Spinal stenosis with neurogenic claudication.
Bilateral leg weakness.

EXAM:
MRI LUMBAR SPINE WITHOUT CONTRAST
TECHNIQUE: Multiplanar, multisequence MR imaging of the lumbar spine was
performed. No intravenous contrast was administered.

[Series 5: T2 · sagittal · 4.0mm · 0.73mm/px · 6 of 16 slices shown (1 of 2)]
[im 1/16]
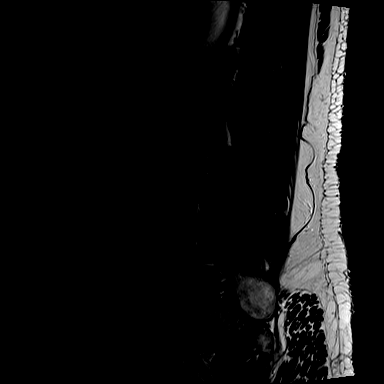
[im 4/16]
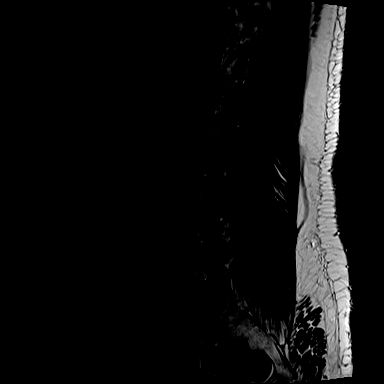
[im 7/16]
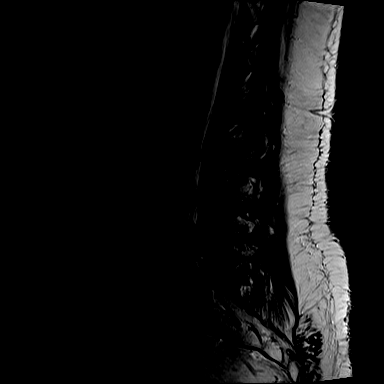
[im 10/16]
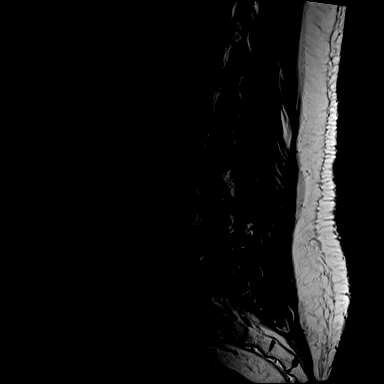
[im 13/16]
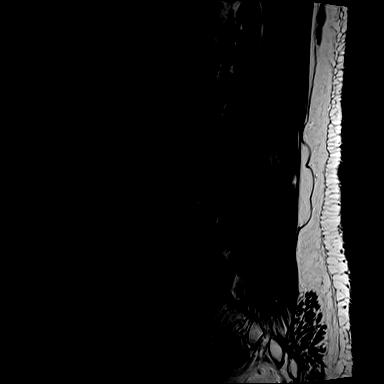
[im 16/16]
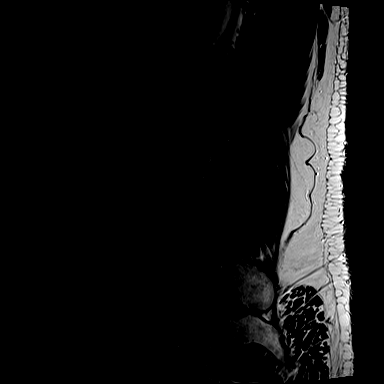

[Series 7: T1 · sagittal · 4.0mm · 0.88mm/px · 7 of 16 slices shown (1 of 2)]
[im 1/16]
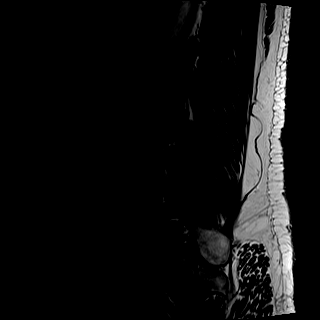
[im 3/16]
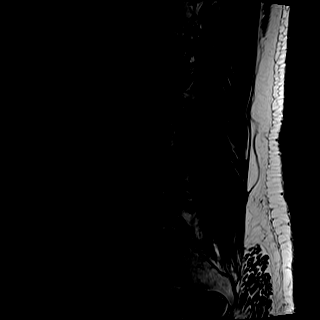
[im 6/16]
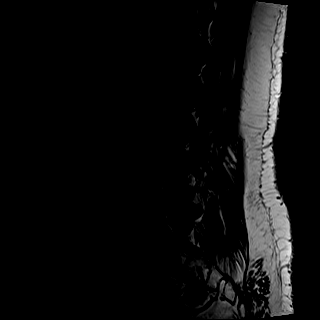
[im 8/16]
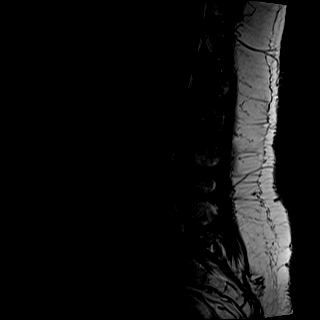
[im 11/16]
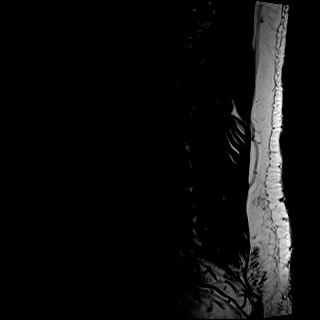
[im 13/16]
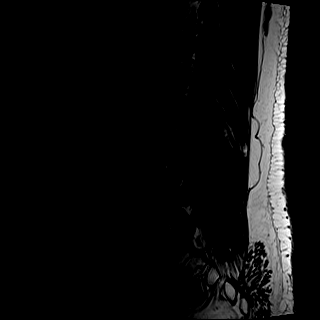
[im 16/16]
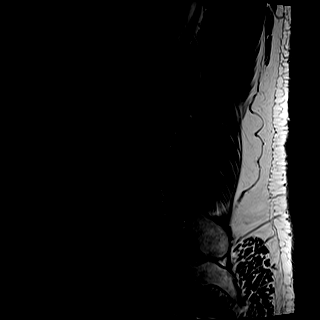

[Series 8: T2 · axial · 4.0mm · 0.57mm/px · z∈[-115,+76]mm · 8 of 35 slices shown (2 of 2)]
[im 1/35]
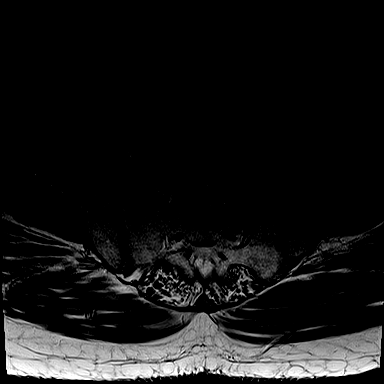
[im 6/35]
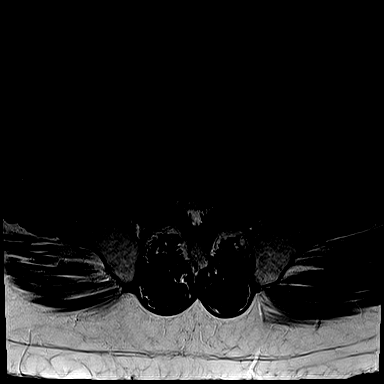
[im 11/35]
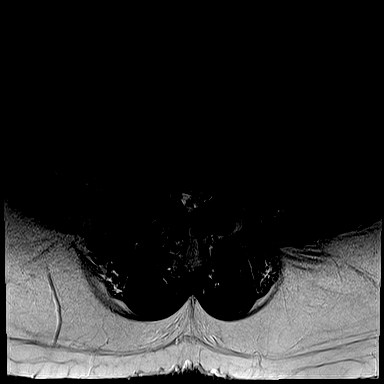
[im 16/35]
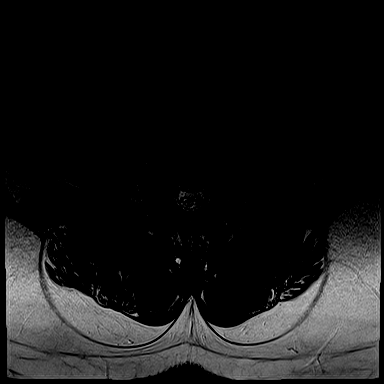
[im 19/35]
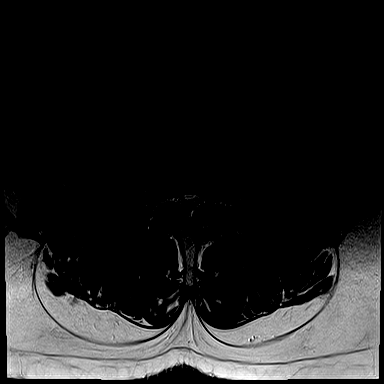
[im 24/35]
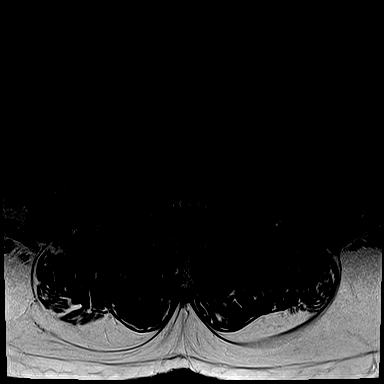
[im 29/35]
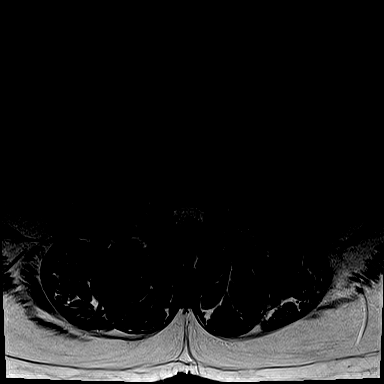
[im 35/35]
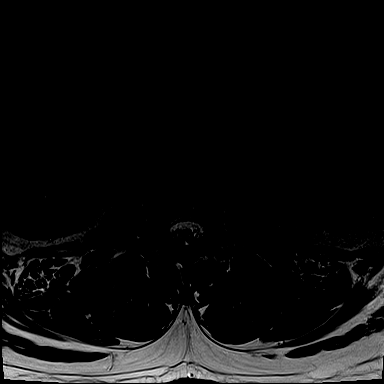

[Series 9: T1 · axial · 4.0mm · 0.34mm/px · z∈[-115,+46]mm · 5 of 35 slices shown (2 of 2)]
[im 1/35]
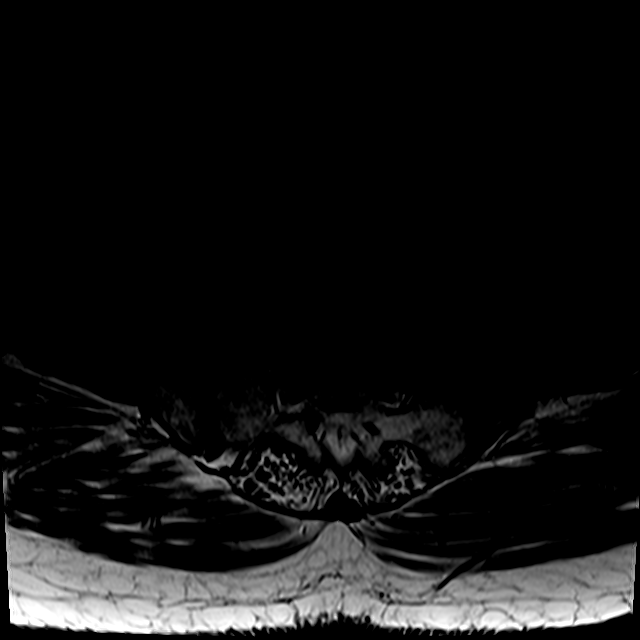
[im 6/35]
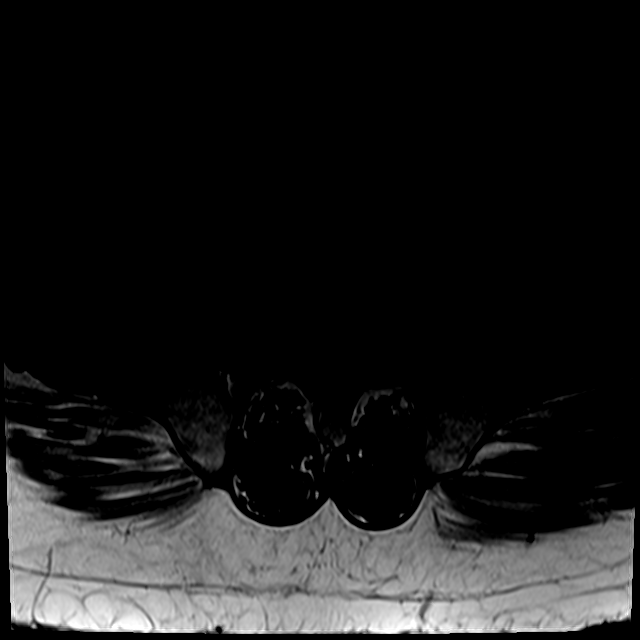
[im 11/35]
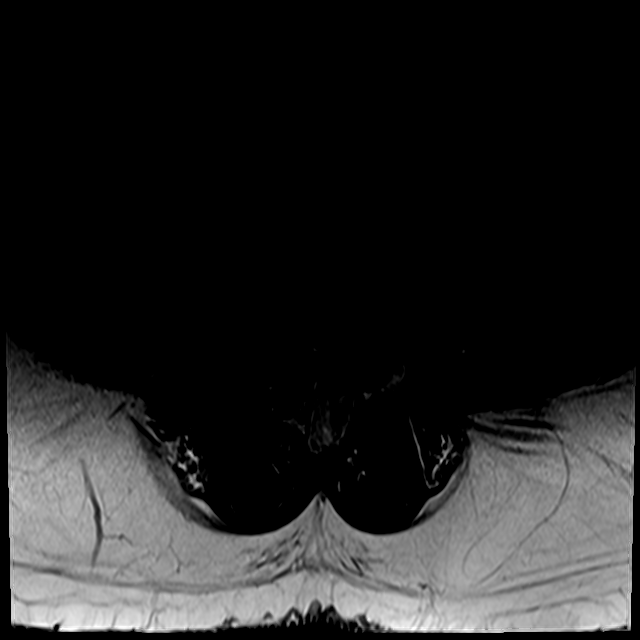
[im 19/35]
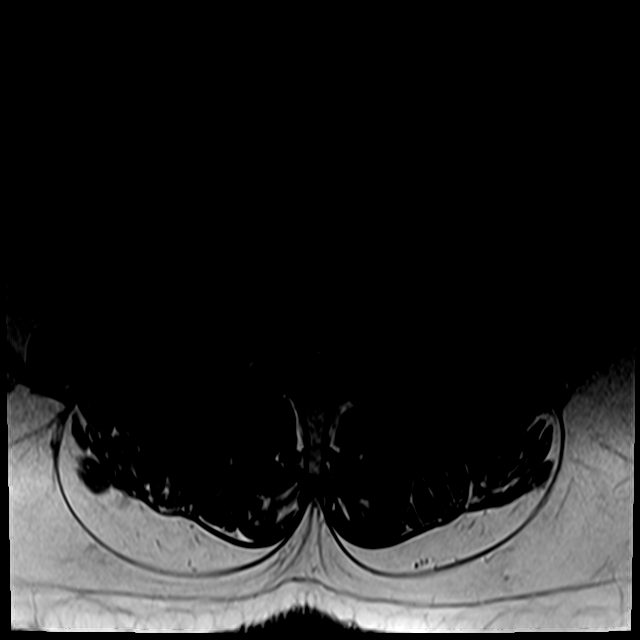
[im 29/35]
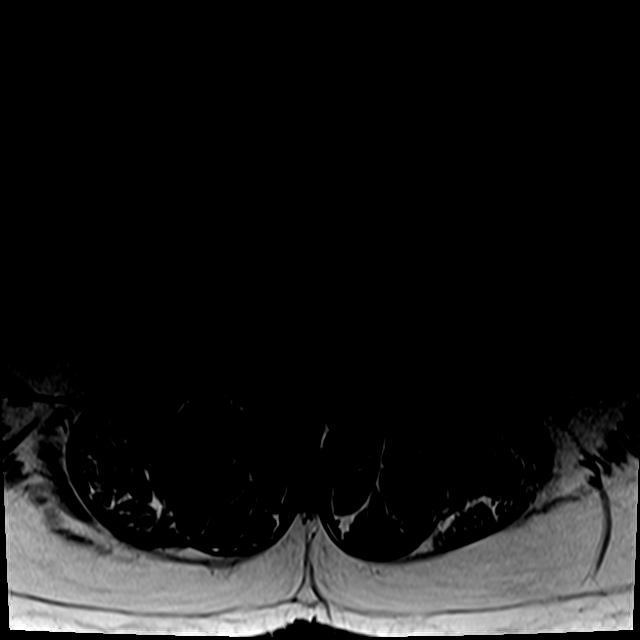

[26 of 48 positions shown; findings below may reference images not displayed]

FINDINGS: Segmentation: There are five lumbar type vertebral bodies. The last
full intervertebral disc space is labeled L5-S1. This correlates
with the prior study.

Alignment:  Normal

Vertebrae:  Normal marrow signal.  No bone lesions or fractures.

Conus medullaris and cauda equina: Conus extends to the L1 level.
Conus and cauda equina appear normal.

Paraspinal and other soft tissues: No significant paraspinal or
retroperitoneal findings.

Disc levels:

L1-2: Mild to moderate facet disease but no disc protrusions, spinal
or foraminal stenosis.

L2-3: Moderate facet disease but no disc protrusions, spinal or
foraminal stenosis.

L3-4: Mild diffuse annular bulge, moderate to advanced facet disease
and ligamentum flavum thickening contributing to mild bilateral
lateral recess stenosis which appears relatively stable. No
significant spinal or foraminal stenosis. Mild foraminal
encroachment bilaterally.

L4-5: Persistent broad-based disc protrusion, diffuse annular bulge,
short pedicles, advanced facet disease and ligamentum flavum
thickening all contributing to moderately severe spinal and
bilateral lateral recess stenosis and mild to moderate bilateral
foraminal stenosis.

L5-S1: Advanced facet disease but no disc protrusions, spinal or
foraminal stenosis.
IMPRESSION: 1. Stable moderately severe multifactorial spinal and bilateral
lateral recess stenosis at L4-5 along mild to moderate bilateral
foraminal stenosis.
2. Stable appearing mild bilateral lateral recess stenosis at L3-4.
There is also mild foraminal encroachment bilaterally at this level.
3. Age advanced facet disease but no definite pars defects.

## 2021-08-03 ENCOUNTER — Ambulatory Visit: Payer: 59 | Admitting: Medical

## 2021-08-22 ENCOUNTER — Ambulatory Visit: Payer: 59 | Admitting: Medical

## 2021-08-22 ENCOUNTER — Encounter: Payer: Self-pay | Admitting: Medical

## 2021-08-22 VITALS — BP 155/85 | HR 80 | Temp 98.2°F | Resp 18 | Ht 74.0 in | Wt 279.0 lb

## 2021-08-22 DIAGNOSIS — I4891 Unspecified atrial fibrillation: Secondary | ICD-10-CM

## 2021-08-22 DIAGNOSIS — I10 Essential (primary) hypertension: Secondary | ICD-10-CM | POA: Diagnosis not present

## 2021-08-22 DIAGNOSIS — E1169 Type 2 diabetes mellitus with other specified complication: Secondary | ICD-10-CM | POA: Diagnosis not present

## 2021-08-22 DIAGNOSIS — E785 Hyperlipidemia, unspecified: Secondary | ICD-10-CM | POA: Diagnosis not present

## 2021-08-22 MED ORDER — LISINOPRIL 10 MG PO TABS: 10.0000 mg | ORAL_TABLET | Freq: Every day | ORAL | 3 refills | Status: DC

## 2021-08-22 MED ORDER — AMLODIPINE BESYLATE 5 MG PO TABS: 5.0000 mg | ORAL_TABLET | Freq: Every day | ORAL | 3 refills | Status: DC

## 2021-08-22 MED ORDER — METOPROLOL TARTRATE 50 MG PO TABS: 50.0000 mg | ORAL_TABLET | Freq: Two times a day (BID) | ORAL | 3 refills | Status: DC
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
## 2021-08-22 NOTE — Patient Instructions (Addendum)
Paroxysmal atrial fibrillation/flutter:   CHA2DS2-VASc of 2.  in the past stopped taking Eliquis as it is quite expensive and in past preferred to avoid anticoagulation if possible.  cardiologist in past scheduled  Linq monitor implant. However lost to follow up. Unclear what happened. Brief transient palpitations 3 times a month that may be recurrent paroxysmal atrial fibrillation. Ekg done today. Sinus rhythm. No atrial fibrillation seen. No changes compared to 04-20-2021.   Placing new referral to cardiologist. Will ask if they can see you quickly.  Htn- bp high and not on any medications.  Starting you back on lisinopril10 mg daily and  lopressor 50 mg  twice daily. Not to fill the amlodipine..  For diabetes get a1c and cmp.  For high cholesterol get lipid panel. Plan to restart your atorvastatin 10 mg daily.   Important to note your The 10-year ASCVD risk score (Arnett DK, et al., 2019) is: 26.2%   Values used to calculate the score:     Age: 56 years     Sex: Male     Is Non-Hispanic African American: Yes     Diabetic: Yes     Tobacco smoker: No     Systolic Blood Pressure: 155 mmHg     Is BP treated: Yes     HDL Cholesterol: 38.1 mg/dL     Total Cholesterol: 174 mg/dL    Please let me know by Friday of this week if cardiologist. Could possibly get you scheduled [place] if delay referring you back to Dr. Elberta Fortis.  Follow up in one week for blood pressure recheck. If you have any significant constant cardiac symptoms or neurologic signs/symptoms be seen in ED.

## 2021-08-23 LAB — CBC WITH DIFFERENTIAL/PLATELET
Basophils Absolute: 0.1 10*3/uL (ref 0.0–0.1)
Basophils Relative: 0.6 % (ref 0.0–3.0)
Eosinophils Absolute: 0.1 10*3/uL (ref 0.0–0.7)
Eosinophils Relative: 1.8 % (ref 0.0–5.0)
HCT: 40.3 % (ref 39.0–52.0)
Hemoglobin: 13.2 g/dL (ref 13.0–17.0)
Lymphocytes Relative: 26.6 % (ref 12.0–46.0)
Lymphs Abs: 2.2 10*3/uL (ref 0.7–4.0)
MCHC: 32.9 g/dL (ref 30.0–36.0)
MCV: 83.8 fl (ref 78.0–100.0)
Monocytes Absolute: 0.8 10*3/uL (ref 0.1–1.0)
Monocytes Relative: 10 % (ref 3.0–12.0)
Neutro Abs: 5 10*3/uL (ref 1.4–7.7)
Neutrophils Relative %: 61 % (ref 43.0–77.0)
Platelets: 245 10*3/uL (ref 150.0–400.0)
RBC: 4.8 Mil/uL (ref 4.22–5.81)
RDW: 13.8 % (ref 11.5–15.5)
WBC: 8.2 10*3/uL (ref 4.0–10.5)

## 2021-08-23 LAB — LDL CHOLESTEROL, DIRECT: Direct LDL: 114 mg/dL

## 2021-08-23 LAB — LIPID PANEL
Cholesterol: 179 mg/dL (ref 0–200)
HDL: 47.5 mg/dL (ref >39.00)
NonHDL: 131.75
Total CHOL/HDL Ratio: 4
Triglycerides: 239 mg/dL — ABNORMAL HIGH (ref 0.0–149.0)
VLDL: 47.8 mg/dL — ABNORMAL HIGH (ref 0.0–40.0)

## 2021-08-23 LAB — COMPREHENSIVE METABOLIC PANEL
ALT: 19 U/L (ref 0–53)
AST: 16 U/L (ref 0–37)
Albumin: 4.5 g/dL (ref 3.5–5.2)
Alkaline Phosphatase: 83 U/L (ref 39–117)
BUN: 15 mg/dL (ref 6–23)
CO2: 26 mEq/L (ref 19–32)
Calcium: 9.7 mg/dL (ref 8.4–10.5)
Chloride: 100 mEq/L (ref 96–112)
Creatinine, Ser: 1.17 mg/dL (ref 0.40–1.50)
GFR: 71.11 mL/min (ref >60.00)
Glucose, Bld: 79 mg/dL (ref 70–99)
Potassium: 4.1 mEq/L (ref 3.5–5.1)
Sodium: 137 mEq/L (ref 135–145)
Total Bilirubin: 0.6 mg/dL (ref 0.2–1.2)
Total Protein: 7.6 g/dL (ref 6.0–8.3)

## 2021-08-23 LAB — HEMOGLOBIN A1C: Hgb A1c MFr Bld: 6.4 % (ref 4.6–6.5)

## 2021-08-23 MED ORDER — ATORVASTATIN CALCIUM 10 MG PO TABS: 10.0000 mg | ORAL_TABLET | Freq: Every day | ORAL | 3 refills | Status: DC

## 2021-08-23 NOTE — Addendum Note (Signed)
Addended by: Gwenevere Abbot on: 08/23/2021 12:51 PM   Modules accepted: Orders

## 2021-08-28 ENCOUNTER — Other Ambulatory Visit: Payer: Self-pay

## 2021-08-28 ENCOUNTER — Emergency Department (HOSPITAL_BASED_OUTPATIENT_CLINIC_OR_DEPARTMENT_OTHER)
Admission: EM | Admit: 2021-08-28 | Discharge: 2021-08-28 | Disposition: A | Discharge status: Home / Self Care | Payer: 59 | Attending: Emergency Medicine | Admitting: Emergency Medicine

## 2021-08-28 ENCOUNTER — Encounter (HOSPITAL_BASED_OUTPATIENT_CLINIC_OR_DEPARTMENT_OTHER): Payer: Self-pay | Admitting: Emergency Medicine

## 2021-08-28 DIAGNOSIS — J069 Acute upper respiratory infection, unspecified: Secondary | ICD-10-CM | POA: Diagnosis not present

## 2021-08-28 DIAGNOSIS — Z7984 Long term (current) use of oral hypoglycemic drugs: Secondary | ICD-10-CM | POA: Insufficient documentation

## 2021-08-28 DIAGNOSIS — Z79899 Other long term (current) drug therapy: Secondary | ICD-10-CM | POA: Diagnosis not present

## 2021-08-28 DIAGNOSIS — E119 Type 2 diabetes mellitus without complications: Secondary | ICD-10-CM | POA: Insufficient documentation

## 2021-08-28 DIAGNOSIS — Z20822 Contact with and (suspected) exposure to covid-19: Secondary | ICD-10-CM | POA: Insufficient documentation

## 2021-08-28 DIAGNOSIS — Z7901 Long term (current) use of anticoagulants: Secondary | ICD-10-CM | POA: Diagnosis not present

## 2021-08-28 DIAGNOSIS — I10 Essential (primary) hypertension: Secondary | ICD-10-CM | POA: Diagnosis not present

## 2021-08-28 DIAGNOSIS — R059 Cough, unspecified: Secondary | ICD-10-CM | POA: Diagnosis present

## 2021-08-28 LAB — RESP PANEL BY RT-PCR (FLU A&B, COVID) ARPGX2
Influenza A by PCR: NEGATIVE
Influenza B by PCR: NEGATIVE
SARS Coronavirus 2 by RT PCR: NEGATIVE

## 2021-08-28 MED ORDER — BENZONATATE 100 MG PO CAPS: 100.0000 mg | ORAL_CAPSULE | Freq: Three times a day (TID) | ORAL | 0 refills | Status: DC

## 2021-08-28 MED ORDER — LACTATED RINGERS IV BOLUS
1000.0000 mL | Freq: Once | INTRAVENOUS | Status: AC
  Administered 2021-08-28: 1000 mL via INTRAVENOUS

## 2021-08-28 NOTE — ED Triage Notes (Signed)
Since Tuesday has been having runny nose, productive cough and head congestion.

## 2021-08-28 NOTE — Discharge Instructions (Signed)
Your COVID and flu test was negative today.  Continue doing the treatments you are doing at home and also add an either Robitussin or Mucinex to help with with your congestion.  Additionally, I have sent you in a medication that you can use for cough suppression at night in order to help with sleep.  Please drink plenty of water and get some rest.  You should feel better in a few days.

## 2021-08-28 NOTE — ED Provider Notes (Signed)
[facility] EMERGENCY DEPARTMENT Provider Note   CSN: [id number] Arrival date & time: 08/28/21  0816     History  Chief Complaint  Patient presents with   URI    [name] is a 56 y.o. male who presents to the emergency department for evaluation of cough and congestion for approximately 5 days.  Patient states symptoms include nasal congestion, productive cough with yellow sputum, body aches.  He had subjective fever yesterday and thinks he "peaked at 99-100F".  He has been taking Tylenol, Motrin and TheraFlu over-the-counter without significant improvement.  He has headache described as pressure secondary to congestion.  Denies chest pain, shortness of breath, abdominal pain, nausea, vomiting, diarrhea.  Patient states that he is dehydrated and requesting fluids.   URI      Home Medications Prior to Admission medications   Medication Sig Start Date End Date Taking? Authorizing Provider  benzonatate (TESSALON) 100 MG capsule Take 1 capsule (100 mg total) by mouth every 8 (eight) hours. 08/28/21  Yes Raynald Blendonklin, Angelee Bahr R, PA-C  amLODipine (NORVASC) 5 MG tablet Take 1 tablet (5 mg total) by mouth daily. 08/22/21   Saguier, Ramon DredgeEdward, PA-C  amoxicillin (AMOXIL) 500 MG capsule Take 2 capsules (1,000 mg total) by mouth 2 (two) times daily. Patient not taking: Reported on 04/19/2021 01/11/21   Eber HongMiller, Brian, MD  apixaban (ELIQUIS) 5 MG TABS tablet Take 1 tablet (5 mg total) by mouth 2 (two) times daily. Patient not taking: Reported on 04/19/2021 05/08/19   Jacalyn LefevreHaviland, Julie, MD  atorvastatin (LIPITOR) 10 MG tablet Take 1 tablet (10 mg total) by mouth daily. 08/23/21   Saguier, Ramon DredgeEdward, PA-C  empagliflozin (JARDIANCE) 10 MG TABS tablet Take 1 tablet (10 mg total) by mouth daily before breakfast. Patient not taking: Reported on 04/19/2021 02/08/21   Saguier, Ramon DredgeEdward, PA-C  fluticasone [facility](FLONASE) 50 MCG/ACT nasal spray Place 1 spray into both nostrils daily. Patient not taking: Reported on  04/19/2021 08/05/19   Bethel BornGekas, Kelly Marie, PA-C  glipiZIDE (GLUCOTROL) 5 MG tablet Take 1 tablet (5 mg total) by mouth 2 (two) times daily. 01/31/21   Saguier, Ramon DredgeEdward, PA-C  HYDROcodone-acetaminophen (NORCO/VICODIN) 5-325 MG tablet Take 1 tablet by mouth every 6 (six) hours as needed. Patient not taking: Reported on 04/19/2021 06/24/20   Graciella FreerLayden, Lindsey A, PA-C  ibuprofen (ADVIL) 800 MG tablet Take 1 tablet (800 mg total) by mouth every 8 (eight) hours as needed for moderate pain. Patient not taking: Reported on 04/19/2021 08/14/19   Kathryne HitchBlackman, Christopher Y, MD  lidocaine (XYLOCAINE) 2 % solution Use as directed 15 mLs in the mouth or throat every 4 (four) hours as needed for mouth pain. Patient not taking: Reported on 04/19/2021 01/11/21   Eber HongMiller, Brian, MD  lisinopril (ZESTRIL) 10 MG tablet Take 1 tablet (10 mg total) by mouth daily. 08/22/21   Saguier, Ramon DredgeEdward, PA-C  methocarbamol (ROBAXIN) 500 MG tablet Take 1 tablet (500 mg total) by mouth every 8 (eight) hours as needed for muscle spasms. Patient not taking: Reported on 04/19/2021 05/22/19   Eldred MangesYates, Mark C, MD  metoprolol tartrate (LOPRESSOR) 50 MG tablet Take 1 tablet (50 mg total) by mouth 2 (two) times daily. 08/22/21   Saguier, Ramon DredgeEdward, PA-C  Misc Natural Products (TART CHERRY ADVANCED PO) Take 2 capsules by mouth daily. Patient not taking: Reported on 04/19/2021    [provider]      Allergies    Metformin and related    Review of Systems   Review of Systems  Physical Exam Updated Vital Signs BP (!) 155/95 (BP Location: Right Arm)   Pulse 82   Temp 98.2 F (36.8 C) (Oral)   Resp 18   SpO2 99%  Physical Exam Vitals and nursing note reviewed.  Constitutional:      General: He is not in acute distress.    Appearance: He is ill-appearing.  HENT:     Head: Atraumatic.     Right Ear: Tympanic membrane normal.     Left Ear: Tympanic membrane normal.     Nose: Congestion present.  Eyes:     Conjunctiva/sclera: Conjunctivae normal.   Cardiovascular:     Rate and Rhythm: Normal rate and regular rhythm.     Pulses: Normal pulses.     Heart sounds: No murmur heard. Pulmonary:     Effort: Pulmonary effort is normal. No respiratory distress.     Breath sounds: Normal breath sounds.  Abdominal:     General: Abdomen is flat. There is no distension.     Palpations: Abdomen is soft.     Tenderness: There is no abdominal tenderness.  Musculoskeletal:        General: Normal range of motion.     Cervical back: Normal range of motion.  Skin:    General: Skin is warm and dry.     Capillary Refill: Capillary refill takes less than 2 seconds.  Neurological:     General: No focal deficit present.     Mental Status: He is alert.  Psychiatric:        Mood and Affect: Mood normal.     ED Results / Procedures / Treatments   Labs (all labs ordered are listed, but only abnormal results are displayed) Labs Reviewed  RESP PANEL BY RT-PCR (FLU A&B, COVID) [id number]    EKG None  Radiology No results found.  Procedures Procedures    Medications Ordered in ED Medications  lactated ringers bolus 1,000 mL (1,000 mLs Intravenous New Bag/Given 08/28/21 3818)    ED Course/ Medical Decision Making/ A&P                           Medical Decision Making Risk Prescription drug management.   Social determinants of health:  Social History   Socioeconomic History   Marital status: Single    Spouse name: Not on file   Number of children: Not on file   Years of education: Not on file   Highest education level: Not on file  Occupational History   Not on file  Tobacco Use   Smoking status: Never   Smokeless tobacco: Current    Types: Chew  Vaping Use   Vaping Use: Never used  Substance and Sexual Activity   Alcohol use: Yes    Comment: occ   Drug use: No   Sexual activity: Not on file  Other Topics Concern   Not on file  Social History Narrative   Not on file   Social Determinants of Health   Financial  Resource Strain: Not on file  Food Insecurity: Not on file  Transportation Needs: Not on file  Physical Activity: Not on file  Stress: Not on file  Social Connections: Not on file  Intimate Partner Violence: Not on file     Initial impression:  This patient presents to the ED for concern of upper respiratory symptoms, this involves an extensive number of treatment options, and is a complaint that carries with it a high risk  of complications and morbidity.   Differentials include viral URI, COVID, pneumonia.   Comorbidities affecting care:  Hypertension, diabetes, hyperlipidemia, paroxysmal A-fib   Lab Tests  I Ordered, reviewed, and interpreted labs and EKG.  The pertinent results include:  COVID and flu negative  Medicines ordered and prescription drug management:  I ordered medication including: 1 L LR bolus Reevaluation of the patient after these medicines showed that the patient improved I have reviewed the patients home medicines and have made adjustments as needed  ED Course/Re-evaluation: 57 year old male presents to the emergency department for evaluation of upper respiratory symptoms including head congestion and cough.  Vitals without significant abnormality.  He is ill-appearing although nontoxic and in no acute distress.  Nasal congestion noted when speaking.  On exam, his lungs are CTA bilaterally.  Remainder physical exam otherwise benign.  Patient is adamantly requesting IV fluids although denies nausea and vomiting.  We will give IV fluids and obtain COVID swab with likely discharge after.  I did consider obtaining chest x-ray, however patient's O2 sats were 99% on room air, lungs CTA bilaterally and I did not think this was clinically indicated at this time.  Patient feeling better after 1 L fluid bolus.  Discussed at home medications and add in either Robitussin or Mucinex to help with congestion.  Provided prescription for [employer].  Patient expresses  understanding of the plan.  Disposition:  After consideration of the diagnostic results, physical exam, history and the patients response to treatment feel that the patent would benefit from discharge.   Viral URI: Plan and management as described above. Discharged home in good condition.   Final Clinical Impression(s) / ED Diagnoses Final diagnoses:  Viral URI with cough    Rx / DC Orders ED Discharge Orders          Ordered    benzonatate (TESSALON) 100 MG capsule  Every 8 hours        08/28/21 1035              Janell Quiet, [place] 08/28/21 1038    Tegeler, Canary Brim, MD 08/28/21 1521

## 2021-08-28 NOTE — ED Notes (Signed)
Pt discharged to home. Discharge instructions have been discussed with patient and/or family members. Pt verbally acknowledges understanding d/c instructions, and endorses comprehension to checkout at registration before leaving.  °

## 2021-08-31 ENCOUNTER — Ambulatory Visit: Payer: 59 | Admitting: Medical

## 2021-10-17 ENCOUNTER — Ambulatory Visit: Payer: 59 | Admitting: Cardiology
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
## 2021-11-02 ENCOUNTER — Encounter: Payer: Self-pay | Admitting: Cardiology

## 2021-11-02 ENCOUNTER — Ambulatory Visit (INDEPENDENT_AMBULATORY_CARE_PROVIDER_SITE_OTHER): Payer: 59 | Admitting: Cardiology

## 2021-11-02 VITALS — BP 142/94 | HR 57 | Ht 74.0 in | Wt 280.4 lb

## 2021-11-02 DIAGNOSIS — I10 Essential (primary) hypertension: Secondary | ICD-10-CM

## 2021-11-02 DIAGNOSIS — E7800 Pure hypercholesterolemia, unspecified: Secondary | ICD-10-CM

## 2021-11-02 DIAGNOSIS — I480 Paroxysmal atrial fibrillation: Secondary | ICD-10-CM | POA: Diagnosis not present

## 2021-11-02 MED ORDER — LISINOPRIL 20 MG PO TABS: 20.0000 mg | ORAL_TABLET | Freq: Every day | ORAL | 11 refills | Status: DC

## 2021-11-02 MED ORDER — ATORVASTATIN CALCIUM 40 MG PO TABS: 40.0000 mg | ORAL_TABLET | Freq: Every day | ORAL | 11 refills | Status: DC

## 2021-11-02 NOTE — Patient Instructions (Signed)
Medication Instructions:   INCREASE Lisinopril one (1) tablet by mouth (20 mg) daily.  INCREASE Lipitor one (1) tablet by mouth (40 mg) daily.    *If you need a refill on your cardiac medications before your next appointment, please call your pharmacy*   Lab Work:  Your physician recommends that you return for a FASTING lipid profile/lft. You can come in on the day of your appointment anytime between 8:00 4:30 fasting from midnight the night before. Not between 12-1. Around the 1st or 2nd week in October.   If you have labs (blood work) drawn today and your tests are completely normal, you will receive your results only by: MyChart Message (if you have MyChart) OR A paper copy in the mail If you have any lab test that is abnormal or we need to change your treatment, we will call you to review the results.   Testing/Procedures:  Our office will call to schedule your loop recorder with Dr. Elberta Fortis.    Follow-Up: At [facility], you and your health needs are our priority.  As part of our continuing mission to provide you with exceptional heart care, we have created designated Provider Care Teams.  These Care Teams include your primary Cardiologist (physician) and Advanced Practice Providers (APPs -  Physician Assistants and Nurse Practitioners) who all work together to provide you with the care you need, when you need it.  We recommend signing up for the patient portal called "MyChart".  Sign up information is provided on this After Visit Summary.  MyChart is used to connect with patients for Virtual Visits (Telemedicine).  Patients are able to view lab/test results, encounter notes, upcoming appointments, etc.  Non-urgent messages can be sent to your provider as well.   To learn more about what you can do with MyChart, go to [email or web address].    Your next appointment:   6 month(s)  The format for your next appointment:   In Person  Provider:   Olga Millers, MD     Other Instructions  Your physician wants you to follow-up in: 6 months with Dr.Crenshaw.  You will receive a reminder letter in the mail two months in advance. If you don't receive a letter, please call our office to schedule the follow-up appointment.   Important Information About Sugar

## 2021-11-15 ENCOUNTER — Other Ambulatory Visit: Payer: Self-pay | Admitting: Medical

## 2021-12-08 ENCOUNTER — Encounter: Payer: Self-pay | Admitting: Gastroenterology

## 2021-12-08 ENCOUNTER — Telehealth: Payer: Self-pay | Admitting: Medical

## 2021-12-08 DIAGNOSIS — Z1211 Encounter for screening for malignant neoplasm of colon: Secondary | ICD-10-CM

## 2021-12-08 NOTE — Telephone Encounter (Signed)
Sister, Burnetta Sabin, called to request order for colonoscopy because he has not had one. She would like to know if an order can be placed for one due to his age. Please call patient to advise

## 2021-12-08 NOTE — Telephone Encounter (Signed)
Referral to GI placed for colonoscopy.  

## 2021-12-28 ENCOUNTER — Ambulatory Visit: Payer: 59 | Attending: Cardiology | Admitting: Cardiology

## 2021-12-28 ENCOUNTER — Encounter: Payer: Self-pay | Admitting: Cardiology

## 2021-12-28 ENCOUNTER — Encounter: Payer: Self-pay | Admitting: *Deleted

## 2021-12-28 VITALS — BP 124/82 | HR 68 | Ht 74.0 in | Wt 281.4 lb

## 2021-12-28 DIAGNOSIS — I480 Paroxysmal atrial fibrillation: Secondary | ICD-10-CM

## 2021-12-28 NOTE — Patient Instructions (Signed)
Medication Instructions:  Your physician recommends that you continue on your current medications as directed. Please refer to the Current Medication list given to you today.  Labwork: None ordered.  Testing/Procedures: None ordered.  Follow-Up:  Your physician wants you to follow-up in: as needed.  You will receive a reminder letter in the mail two months in advance. If you don't receive a letter, please call our office to schedule the follow-up appointment.    Implantable Loop Recorder Placement, Care After This sheet gives you information about how to care for yourself after your procedure. Your health care provider may also give you more specific instructions. If you have problems or questions, contact your health care provider. What can I expect after the procedure? After the procedure, it is common to have: Soreness or discomfort near the incision. Some swelling or bruising near the incision.  Follow these instructions at home: Incision care  Monitor your cardiac device site for redness, swelling, and drainage. Call the device clinic at [phone] if you experience these symptoms or fever/chills.  Keep the large square bandage on your site for 24 hours and then you may remove it yourself. Keep the steri-strips underneath in place.   You may shower after 72 hours / 3 days from your procedure with the steri-strips in place. They will usually fall off on their own, or may be removed after 10 days. Pat dry.   Avoid lotions, ointments, or perfumes over your incision until it is well-healed.  Please do not submerge in water until your site is completely healed.   Your device is MRI compatible.   Remote monitoring is used to monitor your cardiac device from home. This monitoring is scheduled every month by our office. It allows us to keep an eye on the function of your device to ensure it is working properly.  If your wound site starts to bleed apply pressure.    For help  with the monitor please call [employer] Monitor Support Specialist directly at [phone].    If you have any questions/concerns please call the device clinic at [phone].  Activity  Return to your normal activities.  General instructions Follow instructions from your health care provider about how to manage your implantable loop recorder and transmit the information. Learn how to activate a recording if this is necessary for your type of device. You may go through a metal detection gate, and you may let someone hold a metal detector over your chest. Show your ID card if needed. Do not have an MRI unless you check with your health care provider first. Take over-the-counter and prescription medicines only as told by your health care provider. Keep all follow-up visits as told by your health care provider. This is important. Contact a health care provider if: You have redness, swelling, or pain around your incision. You have a fever. You have pain that is not relieved by your pain medicine. You have triggered your device because of fainting (syncope) or because of a heartbeat that feels like it is racing, slow, fluttering, or skipping (palpitations). Get help right away if you have: Chest pain. Difficulty breathing. Summary After the procedure, it is common to have soreness or discomfort near the incision. Change your dressing as told by your health care provider. Follow instructions from your health care provider about how to manage your implantable loop recorder and transmit the information. Keep all follow-up visits as told by your health care provider. This is important. This information is not intended   to replace advice given to you by your health care provider. Make sure you discuss any questions you have with your health care provider. Document Released: 02/15/2015 Document Revised: 04/21/2017 Document Reviewed: 04/21/2017 Elsevier Patient Education  2020 [employer]  
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
## 2021-12-29 ENCOUNTER — Encounter: Payer: Self-pay | Admitting: *Deleted

## 2022-01-17 ENCOUNTER — Ambulatory Visit: Payer: Self-pay

## 2022-01-17 ENCOUNTER — Other Ambulatory Visit: Payer: Self-pay | Admitting: Nurse Practitioner

## 2022-01-17 ENCOUNTER — Telehealth: Payer: Self-pay | Admitting: Medical

## 2022-01-17 DIAGNOSIS — M25551 Pain in right hip: Secondary | ICD-10-CM

## 2022-01-17 DIAGNOSIS — M545 Low back pain, unspecified: Secondary | ICD-10-CM

## 2022-01-17 NOTE — Telephone Encounter (Signed)
Please schedule pt an appointment  
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
## 2022-01-23 ENCOUNTER — Ambulatory Visit: Payer: 59 | Admitting: Medical

## 2022-01-23 VITALS — BP 140/80 | HR 65 | Temp 98.1°F | Resp 18 | Ht 74.0 in | Wt 281.0 lb

## 2022-01-23 DIAGNOSIS — Z87828 Personal history of other (healed) physical injury and trauma: Secondary | ICD-10-CM | POA: Diagnosis not present

## 2022-01-23 DIAGNOSIS — I10 Essential (primary) hypertension: Secondary | ICD-10-CM | POA: Diagnosis not present

## 2022-01-23 DIAGNOSIS — R7871 Abnormal lead level in blood: Secondary | ICD-10-CM

## 2022-01-23 DIAGNOSIS — I4891 Unspecified atrial fibrillation: Secondary | ICD-10-CM | POA: Diagnosis not present

## 2022-01-23 DIAGNOSIS — E119 Type 2 diabetes mellitus without complications: Secondary | ICD-10-CM | POA: Diagnosis not present

## 2022-01-23 DIAGNOSIS — W3400XA Accidental discharge from unspecified firearms or gun, initial encounter: Secondary | ICD-10-CM | POA: Diagnosis not present
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
## 2022-01-23 NOTE — Addendum Note (Signed)
Addended by: Anabel Halon on: 01/23/2022 03:22 PM   Modules accepted: Orders
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
## 2022-01-24 LAB — COMPREHENSIVE METABOLIC PANEL
ALT: 32 U/L (ref 0–53)
AST: 19 U/L (ref 0–37)
Albumin: 4.8 g/dL (ref 3.5–5.2)
Alkaline Phosphatase: 98 U/L (ref 39–117)
BUN: 14 mg/dL (ref 6–23)
CO2: 27 mEq/L (ref 19–32)
Calcium: 9.6 mg/dL (ref 8.4–10.5)
Chloride: 100 mEq/L (ref 96–112)
Creatinine, Ser: 1.2 mg/dL (ref 0.40–1.50)
GFR: 68.78 mL/min (ref >60.00)
Glucose, Bld: 98 mg/dL (ref 70–99)
Potassium: 4.3 mEq/L (ref 3.5–5.1)
Sodium: 137 mEq/L (ref 135–145)
Total Bilirubin: 1.1 mg/dL (ref 0.2–1.2)
Total Protein: 7.8 g/dL (ref 6.0–8.3)

## 2022-01-24 LAB — HEMOGLOBIN A1C: Hgb A1c MFr Bld: 6.8 % — ABNORMAL HIGH (ref 4.6–6.5)

## 2022-01-26 LAB — LEAD, BLOOD (ADULT >= 16 YRS): Lead: 7.7 ug/dL — ABNORMAL HIGH (ref <3.5)

## 2022-01-28 NOTE — Addendum Note (Signed)
Addended by: Gwenevere Abbot on: 01/28/2022 10:29 AM   Modules accepted: Orders

## 2022-01-30 ENCOUNTER — Encounter: Payer: 59 | Admitting: Gastroenterology

## 2022-01-30 ENCOUNTER — Telehealth: Payer: Self-pay

## 2022-01-30 NOTE — Telephone Encounter (Signed)
Results mailed 

## 2022-01-30 NOTE — Telephone Encounter (Signed)
Pt's daughter is requesting his labs results get mailed to him.

## 2022-01-30 NOTE — Telephone Encounter (Signed)
Pt called was advised of results and stated he ok with Referral hematologist. Pt stated no to new medication he will like to try watch his sugars and diet. To recheck in 3 months.

## 2022-01-31 ENCOUNTER — Ambulatory Visit (AMBULATORY_SURGERY_CENTER): Payer: Self-pay

## 2022-01-31 VITALS — Ht 74.0 in | Wt 283.0 lb

## 2022-01-31 DIAGNOSIS — Z1211 Encounter for screening for malignant neoplasm of colon: Secondary | ICD-10-CM

## 2022-01-31 MED ORDER — NA SULFATE-K SULFATE-MG SULF 17.5-3.13-1.6 GM/177ML PO SOLN: 1.0000 | Freq: Once | ORAL | 0 refills | Status: AC

## 2022-01-31 NOTE — Progress Notes (Signed)
No egg or soy allergy known to patient  No issues known to pt with past sedation with any surgeries or procedures Patient denies ever being told they had issues or difficulty with intubation  No FH of Malignant Hyperthermia Pt is not on diet pills Pt is not on  home 02  Pt is not on blood thinners  Pt denies issues with constipation  No A flutter Have any cardiac testing pending- 11/16 follow loop recorder implant Pt instructed to use Singlecare[employer]com or GoodRx for a price reduction on prep  Patient's chart reviewed by Cathlyn Parsons CNRA prior to previsit and patient appropriate for the [facility].  Previsit completed and red dot placed by patient's name on their procedure day (on provider's schedule).

## 2022-02-02 ENCOUNTER — Ambulatory Visit (INDEPENDENT_AMBULATORY_CARE_PROVIDER_SITE_OTHER): Payer: 59

## 2022-02-02 DIAGNOSIS — I480 Paroxysmal atrial fibrillation: Secondary | ICD-10-CM | POA: Diagnosis not present

## 2022-02-02 LAB — CUP PACEART REMOTE DEVICE CHECK
Date Time Interrogation Session: 20231116094645
Implantable Pulse Generator Implant Date: 20231011

## 2022-02-15 ENCOUNTER — Ambulatory Visit (INDEPENDENT_AMBULATORY_CARE_PROVIDER_SITE_OTHER): Payer: Worker's Compensation

## 2022-02-15 ENCOUNTER — Encounter: Payer: Self-pay | Admitting: Orthopaedic Surgery

## 2022-02-15 ENCOUNTER — Ambulatory Visit (INDEPENDENT_AMBULATORY_CARE_PROVIDER_SITE_OTHER): Payer: Worker's Compensation | Admitting: Orthopaedic Surgery

## 2022-02-15 VITALS — BP 175/102 | Ht 74.0 in | Wt 277.0 lb

## 2022-02-15 DIAGNOSIS — M542 Cervicalgia: Secondary | ICD-10-CM

## 2022-02-15 DIAGNOSIS — M545 Low back pain, unspecified: Secondary | ICD-10-CM

## 2022-02-15 DIAGNOSIS — S76011D Strain of muscle, fascia and tendon of right hip, subsequent encounter: Secondary | ICD-10-CM

## 2022-02-15 DIAGNOSIS — S76019A Strain of muscle, fascia and tendon of unspecified hip, initial encounter: Secondary | ICD-10-CM | POA: Insufficient documentation

## 2022-02-15 DIAGNOSIS — M25551 Pain in right hip: Secondary | ICD-10-CM | POA: Diagnosis not present
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
## 2022-02-22 NOTE — Progress Notes (Signed)
Carelink Summary Report / Loop Recorder 

## 2022-03-02 ENCOUNTER — Encounter: Payer: 59 | Admitting: Gastroenterology

## 2022-03-07 ENCOUNTER — Ambulatory Visit (INDEPENDENT_AMBULATORY_CARE_PROVIDER_SITE_OTHER): Payer: 59

## 2022-03-07 DIAGNOSIS — I480 Paroxysmal atrial fibrillation: Secondary | ICD-10-CM | POA: Diagnosis not present

## 2022-03-07 LAB — CUP PACEART REMOTE DEVICE CHECK
Date Time Interrogation Session: 20231219095115
Implantable Pulse Generator Implant Date: 20231011

## 2022-03-19 ENCOUNTER — Emergency Department (HOSPITAL_BASED_OUTPATIENT_CLINIC_OR_DEPARTMENT_OTHER)
Admission: EM | Admit: 2022-03-19 | Discharge: 2022-03-19 | Disposition: A | Discharge status: Home / Self Care | Payer: 59 | Attending: Emergency Medicine | Admitting: Emergency Medicine

## 2022-03-19 ENCOUNTER — Encounter (HOSPITAL_BASED_OUTPATIENT_CLINIC_OR_DEPARTMENT_OTHER): Payer: Self-pay

## 2022-03-19 ENCOUNTER — Other Ambulatory Visit: Payer: Self-pay

## 2022-03-19 DIAGNOSIS — I10 Essential (primary) hypertension: Secondary | ICD-10-CM | POA: Insufficient documentation

## 2022-03-19 DIAGNOSIS — Z7984 Long term (current) use of oral hypoglycemic drugs: Secondary | ICD-10-CM | POA: Insufficient documentation

## 2022-03-19 DIAGNOSIS — M109 Gout, unspecified: Secondary | ICD-10-CM | POA: Insufficient documentation

## 2022-03-19 DIAGNOSIS — Z79899 Other long term (current) drug therapy: Secondary | ICD-10-CM | POA: Insufficient documentation

## 2022-03-19 DIAGNOSIS — M79675 Pain in left toe(s): Secondary | ICD-10-CM | POA: Diagnosis present

## 2022-03-19 DIAGNOSIS — E119 Type 2 diabetes mellitus without complications: Secondary | ICD-10-CM | POA: Insufficient documentation

## 2022-03-19 MED ORDER — COLCHICINE 0.6 MG PO TABS: 0.6000 mg | ORAL_TABLET | Freq: Two times a day (BID) | ORAL | 0 refills | Status: AC

## 2022-03-19 NOTE — ED Provider Notes (Signed)
[facility] EMERGENCY DEPARTMENT Provider Note   CSN: [id number] Arrival date & time: 03/19/22  0932     History  Chief Complaint  Patient presents with   Toe Pain    [name] is a 56 y.o. male with past medical history significant for hypertension, diabetes, gout who presents with concern for left great toe swelling for the last several days.  Patient reports this feels similar to previous gout flares.  He has been trying Voltaren gel, ibuprofen, rest without significant relief.  Patient reports that he does not do well with steroids secondary to it significantly worsening his diabetes.  Patient reports he has not checked his blood sugar in 1 to 2 weeks.   Toe Pain       Home Medications Prior to Admission medications   Medication Sig Start Date End Date Taking? Authorizing Provider  colchicine 0.6 MG tablet Take 1 tablet (0.6 mg total) by mouth 2 (two) times daily. 03/19/22  Yes Malyn Aytes H, PA-C  atorvastatin (LIPITOR) 40 MG tablet Take 1 tablet (40 mg total) by mouth daily. 11/02/21 11/03/22  Lewayne Bunting, MD  cyclobenzaprine (FLEXERIL) 5 MG tablet Take 5 mg by mouth at bedtime as needed. 01/17/22   [provider]  glipiZIDE (GLUCOTROL) 5 MG tablet Take 1 tablet by mouth twice daily 11/15/21   Saguier, Ramon Dredge, PA-C  lisinopril (ZESTRIL) 20 MG tablet Take 1 tablet (20 mg total) by mouth daily. Patient taking differently: Take 10 mg by mouth daily. 11/02/21 11/03/22  Lewayne Bunting, MD  metoprolol tartrate (LOPRESSOR) 50 MG tablet Take 1 tablet (50 mg total) by mouth 2 (two) times daily. 08/22/21   Saguier, Ramon Dredge, PA-C      Allergies    Metformin and related    Review of Systems   Review of Systems  Musculoskeletal:  Positive for arthralgias.  All other systems reviewed and are negative.   Physical Exam Updated Vital Signs BP (!) 156/90 (BP Location: Left Arm)   Pulse 64   Temp 97.9 F (36.6 C) (Oral)   Resp 18   Ht 6\' 2"   (1.88 m)   Wt 125.6 kg   SpO2 97%   BMI 35.56 kg/m  Physical Exam Vitals and nursing note reviewed.  Constitutional:      General: He is not in acute distress.    Appearance: Normal appearance.  HENT:     Head: Normocephalic and atraumatic.  Eyes:     General:        Right eye: No discharge.        Left eye: No discharge.  Cardiovascular:     Rate and Rhythm: Normal rate and regular rhythm.     Pulses: Normal pulses.  Pulmonary:     Effort: Pulmonary effort is normal. No respiratory distress.  Musculoskeletal:        General: No deformity.     Comments: Patient with swelling, tenderness palpation of the left great toe, with some redness, but intact flexion, extension of the joint.  Skin:    General: Skin is warm and dry.     Capillary Refill: Capillary refill takes less than 2 seconds.  Neurological:     Mental Status: He is alert and oriented to person, place, and time.  Psychiatric:        Mood and Affect: Mood normal.        Behavior: Behavior normal.     ED Results / Procedures / Treatments   Labs (all  labs ordered are listed, but only abnormal results are displayed) Labs Reviewed - No data to display  EKG None  Radiology No results found.  Procedures Procedures    Medications Ordered in ED Medications - No data to display  ED Course/ Medical Decision Making/ A&P                           Medical Decision Making  This is an overall well-appearing 56 year old gentleman who presents with concern for left great toe swelling, concern for possible gout.  Physical exam with some swelling of the left great toe, redness, tenderness to palpation.  He has intact DP, PT pulses on the ipsilateral foot.  He has intact range of motion at the interphalangeal joints of the affected toe.  Findings are consistent with gout, especially with no history of traumatic injury.  Discussed possible treatments including steroids, colchicine, versus regular anti-inflammatories,  rest, Voltaren, low purine diet, patient reports that he has had significant worsening of A1c, and blood sugar while taking prednisone and would prefer not to use steroids, requests colchicine, discussed this is reasonable, but warned against risk for GI bleeding, patient understands and agrees with this plan, is discharged with prescription of colchicine, encouraged to follow-up with PCP for prophylactic allopurinol if he continues to have gout flares. Final Clinical Impression(s) / ED Diagnoses Final diagnoses:  Acute gout involving toe of left foot, unspecified cause    Rx / DC Orders ED Discharge Orders          Ordered    colchicine 0.6 MG tablet  2 times daily        03/19/22 1004              Kimothy Kishimoto, Franklin H, PA-C 03/19/22 1011    Rexford Maus, DO 03/19/22 1551

## 2022-03-19 NOTE — ED Triage Notes (Signed)
C/o left great toe swelling, hx of gout, states feels similar.

## 2022-03-19 NOTE — Discharge Instructions (Addendum)
You can use colchicine or ibuprofen, 600 800 mg as well as the Voltaren gel, I would discontinue any alcohol use while you are treating your gout, and talk to your primary care doctor about starting a prophylactic therapy for gout such as allopurinol.  You keep the foot elevated, and apply ice 15 minutes at a time up to twice an hour for help with relief of inflammation.

## 2022-03-21 ENCOUNTER — Telehealth: Payer: Self-pay | Admitting: Cardiology

## 2022-03-21 NOTE — Telephone Encounter (Signed)
Left message to call back  

## 2022-03-21 NOTE — Telephone Encounter (Signed)
Patient returning call.

## 2022-03-21 NOTE — Telephone Encounter (Signed)
Attempted to contact patient. LVM  Left call back number.

## 2022-03-21 NOTE — Telephone Encounter (Signed)
Caller stated the patient would like to have has loop recorder removed.  Caller stated the patient can be called back directly at [phone].
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
## 2022-03-22 NOTE — Telephone Encounter (Signed)
Left message for pt to call.

## 2022-03-23 NOTE — Telephone Encounter (Signed)
Pt informed that scheduler would call to arrange him to come back in for possible ILR explant. Pt agreeable to plan.

## 2022-04-03 NOTE — Progress Notes (Signed)
Carelink Summary Report / Loop Recorder

## 2022-04-05 ENCOUNTER — Ambulatory Visit (INDEPENDENT_AMBULATORY_CARE_PROVIDER_SITE_OTHER): Payer: Worker's Compensation | Admitting: Orthopaedic Surgery

## 2022-04-05 ENCOUNTER — Encounter: Payer: Self-pay | Admitting: Orthopaedic Surgery

## 2022-04-05 VITALS — BP 185/107 | HR 62 | Ht 74.0 in | Wt 277.0 lb

## 2022-04-05 DIAGNOSIS — M545 Low back pain, unspecified: Secondary | ICD-10-CM

## 2022-04-05 DIAGNOSIS — Z9889 Other specified postprocedural states: Secondary | ICD-10-CM | POA: Diagnosis not present

## 2022-04-05 DIAGNOSIS — S76011S Strain of muscle, fascia and tendon of right hip, sequela: Secondary | ICD-10-CM | POA: Diagnosis not present

## 2022-04-05 DIAGNOSIS — M1009 Idiopathic gout, multiple sites: Secondary | ICD-10-CM
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
## 2022-04-06 ENCOUNTER — Other Ambulatory Visit: Payer: Self-pay | Admitting: Orthopaedic Surgery

## 2022-04-06 ENCOUNTER — Other Ambulatory Visit (HOSPITAL_BASED_OUTPATIENT_CLINIC_OR_DEPARTMENT_OTHER): Payer: Self-pay

## 2022-04-06 DIAGNOSIS — M1009 Idiopathic gout, multiple sites: Secondary | ICD-10-CM | POA: Insufficient documentation

## 2022-04-06 LAB — BASIC METABOLIC PANEL
BUN: 14 mg/dL (ref 7–25)
CO2: 25 mmol/L (ref 20–32)
Calcium: 9.8 mg/dL (ref 8.6–10.3)
Chloride: 102 mmol/L (ref 98–110)
Creat: 1.11 mg/dL (ref 0.70–1.30)
Glucose, Bld: 105 mg/dL — ABNORMAL HIGH (ref 65–99)
Potassium: 4.5 mmol/L (ref 3.5–5.3)
Sodium: 138 mmol/L (ref 135–146)

## 2022-04-06 LAB — URIC ACID: Uric Acid, Serum: 9 mg/dL — ABNORMAL HIGH (ref 4.0–8.0)

## 2022-04-06 MED ORDER — NAPROXEN 500 MG PO TABS
500.0000 mg | ORAL_TABLET | Freq: Two times a day (BID) | ORAL | 0 refills | Status: AC
  Filled 2022-04-06 – 2022-04-20 (×2): qty 60, 30d supply, fill #0

## 2022-04-06 MED ORDER — ALLOPURINOL 100 MG PO TABS
ORAL_TABLET | ORAL | 0 refills | Status: DC
  Filled 2022-04-06 – 2022-04-20 (×2): qty 61, 34d supply, fill #0

## 2022-04-07 ENCOUNTER — Telehealth: Payer: Self-pay | Admitting: Orthopaedic Surgery

## 2022-04-07 NOTE — Telephone Encounter (Signed)
Note done and placed up front

## 2022-04-07 NOTE — Telephone Encounter (Signed)
Patient came in stating he needs a note stating he may start back work gradually with restrictions and he will return back for a recheck in 3 weeks. Patient job will not let him come back with the note that was given they want him to have specific restriction or be fully released from care please advise patient needs note Monday

## 2022-04-10 ENCOUNTER — Ambulatory Visit: Payer: 59 | Attending: Cardiology

## 2022-04-10 DIAGNOSIS — I480 Paroxysmal atrial fibrillation: Secondary | ICD-10-CM

## 2022-04-11 ENCOUNTER — Telehealth: Payer: Self-pay | Admitting: Orthopaedic Surgery

## 2022-04-11 LAB — CUP PACEART REMOTE DEVICE CHECK
Date Time Interrogation Session: 20240121095111
Implantable Pulse Generator Implant Date: 20231011

## 2022-04-11 NOTE — Telephone Encounter (Signed)
04/05/22 ov note & work notes faxed to [employer] w/ [place] [phone]

## 2022-04-12 ENCOUNTER — Encounter: Payer: 59 | Admitting: Gastroenterology

## 2022-04-13 ENCOUNTER — Telehealth: Payer: Self-pay | Admitting: Radiology

## 2022-04-13 NOTE — Telephone Encounter (Signed)
Patient came in to office for more detailed work note as his employer did not understand other two notes. Note entered for patient to continue light duty work with no lifting greater than 15 pounds for 3 weeks. Advised work restrictions will be re-evaluated at 04/26/2022 appointment.

## 2022-04-18 ENCOUNTER — Other Ambulatory Visit (HOSPITAL_BASED_OUTPATIENT_CLINIC_OR_DEPARTMENT_OTHER): Payer: Self-pay

## 2022-04-20 ENCOUNTER — Other Ambulatory Visit (HOSPITAL_BASED_OUTPATIENT_CLINIC_OR_DEPARTMENT_OTHER): Payer: Self-pay

## 2022-04-26 ENCOUNTER — Ambulatory Visit: Payer: 59 | Admitting: Orthopaedic Surgery

## 2022-05-01 ENCOUNTER — Ambulatory Visit: Payer: 59 | Admitting: Cardiology

## 2022-05-10 ENCOUNTER — Other Ambulatory Visit (HOSPITAL_BASED_OUTPATIENT_CLINIC_OR_DEPARTMENT_OTHER): Payer: Self-pay

## 2022-05-10 ENCOUNTER — Ambulatory Visit: Payer: 59 | Admitting: Orthopaedic Surgery

## 2022-05-10 ENCOUNTER — Ambulatory Visit (INDEPENDENT_AMBULATORY_CARE_PROVIDER_SITE_OTHER): Payer: Worker's Compensation | Admitting: Orthopaedic Surgery

## 2022-05-10 VITALS — BP 159/96 | HR 67 | Ht 74.0 in | Wt 277.0 lb

## 2022-05-10 DIAGNOSIS — M1009 Idiopathic gout, multiple sites: Secondary | ICD-10-CM

## 2022-05-10 DIAGNOSIS — S76011S Strain of muscle, fascia and tendon of right hip, sequela: Secondary | ICD-10-CM | POA: Diagnosis not present

## 2022-05-10 DIAGNOSIS — Z9889 Other specified postprocedural states: Secondary | ICD-10-CM

## 2022-05-10 MED ORDER — ALLOPURINOL 300 MG PO TABS
300.0000 mg | ORAL_TABLET | Freq: Every day | ORAL | 6 refills | Status: DC
  Filled 2022-05-10: qty 30, 30d supply, fill #0

## 2022-05-10 NOTE — Addendum Note (Signed)
Addended by: Meyer Cory on: 05/10/2022 10:22 AM   Modules accepted: Orders
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
## 2022-05-11 LAB — BASIC METABOLIC PANEL
BUN: 13 mg/dL (ref 7–25)
CO2: 26 mmol/L (ref 20–32)
Calcium: 9.8 mg/dL (ref 8.6–10.3)
Chloride: 101 mmol/L (ref 98–110)
Creat: 1.02 mg/dL (ref 0.70–1.30)
Glucose, Bld: 119 mg/dL — ABNORMAL HIGH (ref 65–99)
Potassium: 4.8 mmol/L (ref 3.5–5.3)
Sodium: 139 mmol/L (ref 135–146)

## 2022-05-11 LAB — URIC ACID: Uric Acid, Serum: 6.3 mg/dL (ref 4.0–8.0)

## 2022-05-11 LAB — EXTRA LAV TOP TUBE

## 2022-05-15 ENCOUNTER — Ambulatory Visit (INDEPENDENT_AMBULATORY_CARE_PROVIDER_SITE_OTHER): Payer: 59

## 2022-05-15 DIAGNOSIS — I480 Paroxysmal atrial fibrillation: Secondary | ICD-10-CM

## 2022-05-15 LAB — CUP PACEART REMOTE DEVICE CHECK
Date Time Interrogation Session: 20240225231407
Implantable Pulse Generator Implant Date: 20231011

## 2022-05-17 ENCOUNTER — Telehealth: Payer: Self-pay | Admitting: Radiology

## 2022-05-17 NOTE — Telephone Encounter (Signed)
Please see responses from Dr. Lorin Mercy below. I have entered note for regular work with no restrictions per Dr. Lorin Mercy.  He has also commented on labwork. Thanks.

## 2022-05-17 NOTE — Telephone Encounter (Signed)
Please see message from work comp carrier and advise. She is looking for specific work restrictions. He had restrictions at 04/13/22 visit, however, nothing was listed on last work note.    Can you advise on this please. WC is asking for Dr. Lorin Mercy MD to address the work restrictions for [name].  [name] 05-16-22 4:04p  SECURE: [name] [id number]  Re: Rt Hip, Lt Side & Neck DOI: 01-12-22  [name]  [name]   *Caution - External email - see footer for warnings*   The MD did not address work restrictions. The work note did not say anything. I need an update for my workers' comp claim. Can you have him address this?    Also, I hope we are not being charged for his gout condition and the labs for that?   My claim is just for his rt. hip and low back.     Thank you,   [name]  [employer] [place] [id number] [place], [place]. [address]  [email or web address]  P: [phone]  F: [phone]  [email or web address]

## 2022-05-19 ENCOUNTER — Other Ambulatory Visit (HOSPITAL_BASED_OUTPATIENT_CLINIC_OR_DEPARTMENT_OTHER): Payer: Self-pay

## 2022-05-19 NOTE — Telephone Encounter (Signed)
noted 

## 2022-05-25 NOTE — Progress Notes (Signed)
Carelink Summary Report / Loop Recorder 

## 2022-05-31 ENCOUNTER — Encounter: Payer: Self-pay | Admitting: *Deleted

## 2022-05-31 ENCOUNTER — Encounter: Payer: Self-pay | Admitting: Cardiology

## 2022-05-31 ENCOUNTER — Ambulatory Visit: Payer: 59 | Attending: Cardiology | Admitting: Cardiology

## 2022-05-31 VITALS — BP 162/96 | HR 74 | Ht 74.0 in | Wt 294.0 lb

## 2022-05-31 DIAGNOSIS — I10 Essential (primary) hypertension: Secondary | ICD-10-CM | POA: Diagnosis not present

## 2022-05-31 DIAGNOSIS — I480 Paroxysmal atrial fibrillation: Secondary | ICD-10-CM

## 2022-05-31 NOTE — Patient Instructions (Addendum)
Medication Instructions:  Your physician recommends that you continue on your current medications as directed. Please refer to the Current Medication list given to you today.  *If you need a refill on your cardiac medications before your next appointment, please call your pharmacy*   Lab Work: None ordered   Testing/Procedures: None ordered   Follow-Up: At [facility], you and your health needs are our priority.  As part of our continuing mission to provide you with exceptional heart care, we have created designated Provider Care Teams.  These Care Teams include your primary Cardiologist (physician) and Advanced Practice Providers (APPs -  Physician Assistants and Nurse Practitioners) who all work together to provide you with the care you need, when you need it.  We recommend signing up for the patient portal called "MyChart".  Sign up information is provided on this After Visit Summary.  MyChart is used to connect with patients for Virtual Visits (Telemedicine).  Patients are able to view lab/test results, encounter notes, upcoming appointments, etc.  Non-urgent messages can be sent to your provider as well.   To learn more about what you can do with MyChart, go to [email or web address].    Your next appointment:   1 year(s)  The format for your next appointment:   In Person  Provider:   You will see one of the following Advanced Practice Providers on your designated Care Team:   Tommye Standard, [place] Legrand Como "Jonni Sanger" Chalmers Cater, [place]    Thank you for choosing [facility]!!   Trinidad Curet, RN [phone]  Other Instructions  Please monitor   Implantable Loop Recorder Removal, Care After This sheet gives you information about how to care for yourself after your procedure. Your health care provider may also give you more specific instructions. If you have problems or questions, contact your health care provider. What can I expect after the procedure? After the  procedure, it is common to have: Soreness or discomfort near the incision. Some swelling or bruising near the incision.  Follow these instructions at home: Incision care  Monitor your cardiac device site for redness, swelling, and drainage. Call the device clinic at [phone] if you experience these symptoms or fever/chills.  Keep the large square bandage on your site for 24 hours and then you may remove it yourself. Keep the steri-strips underneath in place.   You may shower after 72 hours / 3 days from your procedure with the steri-strips in place. They will usually fall off on their own, or may be removed after 10 days. Pat dry.   Avoid lotions, ointments, or perfumes over your incision until it is well-healed.  Please do not submerge in water until your site is completely healed.   If your wound site starts to bleed apply pressure.       If you have any questions/concerns please call the device clinic at [phone].  Activity  Return to your normal activities.  Contact a health care provider if: You have redness, swelling, or pain around your incision. You have a fever.
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
## 2022-06-08 ENCOUNTER — Ambulatory Visit: Payer: 59 | Admitting: Orthopaedic Surgery

## 2022-06-13 ENCOUNTER — Other Ambulatory Visit (HOSPITAL_BASED_OUTPATIENT_CLINIC_OR_DEPARTMENT_OTHER): Payer: Self-pay

## 2022-06-13 ENCOUNTER — Ambulatory Visit (INDEPENDENT_AMBULATORY_CARE_PROVIDER_SITE_OTHER): Payer: Worker's Compensation | Admitting: Orthopaedic Surgery

## 2022-06-13 ENCOUNTER — Encounter: Payer: Self-pay | Admitting: Orthopaedic Surgery

## 2022-06-13 VITALS — BP 149/87 | Ht 74.0 in | Wt 272.0 lb

## 2022-06-13 DIAGNOSIS — S76011S Strain of muscle, fascia and tendon of right hip, sequela: Secondary | ICD-10-CM

## 2022-06-13 DIAGNOSIS — M1009 Idiopathic gout, multiple sites: Secondary | ICD-10-CM | POA: Diagnosis not present

## 2022-06-13 MED ORDER — ALLOPURINOL 300 MG PO TABS
300.0000 mg | ORAL_TABLET | Freq: Every day | ORAL | 6 refills | Status: AC
  Filled 2022-06-13 – 2022-07-04 (×2): qty 30, 30d supply, fill #0

## 2022-06-13 NOTE — Progress Notes (Deleted)
149/87 
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
## 2022-06-19 ENCOUNTER — Ambulatory Visit: Payer: 59

## 2022-06-20 ENCOUNTER — Other Ambulatory Visit (HOSPITAL_BASED_OUTPATIENT_CLINIC_OR_DEPARTMENT_OTHER): Payer: Self-pay

## 2022-06-23 NOTE — Progress Notes (Signed)
Carelink Summary Report / Loop Recorder 

## 2022-06-26 ENCOUNTER — Telehealth: Payer: Self-pay | Admitting: Medical

## 2022-06-26 NOTE — Telephone Encounter (Signed)
Pt needs Blood pressure clearance form for work. Copy made and placed in provider's box. Appt not until 07/19/22.

## 2022-07-04 ENCOUNTER — Other Ambulatory Visit (HOSPITAL_BASED_OUTPATIENT_CLINIC_OR_DEPARTMENT_OTHER): Payer: Self-pay

## 2022-07-19 ENCOUNTER — Ambulatory Visit: Payer: 59 | Admitting: Medical

## 2022-07-20 ENCOUNTER — Ambulatory Visit: Payer: 59 | Admitting: Medical

## 2022-07-24 ENCOUNTER — Ambulatory Visit: Payer: 59

## 2022-08-03 ENCOUNTER — Other Ambulatory Visit (HOSPITAL_BASED_OUTPATIENT_CLINIC_OR_DEPARTMENT_OTHER): Payer: Self-pay

## 2022-08-03 ENCOUNTER — Ambulatory Visit: Payer: 59 | Admitting: Medical

## 2022-08-03 ENCOUNTER — Encounter: Payer: Self-pay | Admitting: Medical

## 2022-08-03 VITALS — BP 150/80 | HR 72 | Ht 74.0 in | Wt 290.0 lb

## 2022-08-03 DIAGNOSIS — E1169 Type 2 diabetes mellitus with other specified complication: Secondary | ICD-10-CM

## 2022-08-03 DIAGNOSIS — Z8739 Personal history of other diseases of the musculoskeletal system and connective tissue: Secondary | ICD-10-CM

## 2022-08-03 DIAGNOSIS — Z7984 Long term (current) use of oral hypoglycemic drugs: Secondary | ICD-10-CM

## 2022-08-03 DIAGNOSIS — I10 Essential (primary) hypertension: Secondary | ICD-10-CM | POA: Diagnosis not present

## 2022-08-03 LAB — HEMOGLOBIN A1C: Hgb A1c MFr Bld: 7.7 % — ABNORMAL HIGH (ref 4.6–6.5)

## 2022-08-03 LAB — COMPREHENSIVE METABOLIC PANEL
ALT: 59 U/L — ABNORMAL HIGH (ref 0–53)
AST: 60 U/L — ABNORMAL HIGH (ref 0–37)
Albumin: 4.6 g/dL (ref 3.5–5.2)
Alkaline Phosphatase: 91 U/L (ref 39–117)
BUN: 12 mg/dL (ref 6–23)
CO2: 29 mEq/L (ref 19–32)
Calcium: 9.7 mg/dL (ref 8.4–10.5)
Chloride: 100 mEq/L (ref 96–112)
Creatinine, Ser: 1.11 mg/dL (ref 0.40–1.50)
GFR: 75.24 mL/min (ref >60.00)
Glucose, Bld: 108 mg/dL — ABNORMAL HIGH (ref 70–99)
Potassium: 4.2 mEq/L (ref 3.5–5.1)
Sodium: 138 mEq/L (ref 135–145)
Total Bilirubin: 0.5 mg/dL (ref 0.2–1.2)
Total Protein: 7.8 g/dL (ref 6.0–8.3)

## 2022-08-03 MED ORDER — GLIPIZIDE 5 MG PO TABS
5.0000 mg | ORAL_TABLET | Freq: Every day | ORAL | 3 refills | Status: DC
  Filled 2022-08-03: qty 90, 90d supply, fill #0
  Filled 2023-04-25: qty 90, 90d supply, fill #1

## 2022-08-03 MED ORDER — ALLOPURINOL 300 MG PO TABS
300.0000 mg | ORAL_TABLET | Freq: Every day | ORAL | 3 refills | Status: AC
Start: 2022-08-03 — End: ?
  Filled 2022-08-03: qty 90, 90d supply, fill #0

## 2022-08-03 MED ORDER — LOSARTAN POTASSIUM 25 MG PO TABS
25.0000 mg | ORAL_TABLET | Freq: Every day | ORAL | 0 refills | Status: DC
  Filled 2022-08-03: qty 30, 30d supply, fill #0

## 2022-08-03 NOTE — Patient Instructions (Addendum)
1. Type 2 diabetes mellitus with other specified complication, without long-term current use of insulin (HCC) Refilled glipizide as you are taking 1 tab daily. If A1c elevated may need to add second med. - Comp Met (CMET) - Hemoglobin A1c  2. History of gout - allopurinol (ZYLOPRIM) 300 MG tablet; Take 1 tablet (300 mg total) by mouth daily.  Dispense: 90 tablet; Refill: 3  3. Hypertension, unspecified type  Continue lopressor and rx losartan 25 mg daily. Rx advisement given. Bp today 150/80. 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   Follow up in 2 weeks with me for bp check or sooner
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
## 2022-08-05 MED ORDER — CANAGLIFLOZIN 100 MG PO TABS
100.0000 mg | ORAL_TABLET | Freq: Every day | ORAL | 3 refills | Status: DC
  Filled 2022-08-05: qty 30, 30d supply, fill #0

## 2022-08-05 NOTE — Addendum Note (Signed)
Addended by: Gwenevere Abbot on: 08/05/2022 10:35 AM   Modules accepted: Orders

## 2022-08-07 ENCOUNTER — Telehealth: Payer: Self-pay

## 2022-08-07 ENCOUNTER — Other Ambulatory Visit (HOSPITAL_BASED_OUTPATIENT_CLINIC_OR_DEPARTMENT_OTHER): Payer: Self-pay

## 2022-08-07 MED ORDER — EMPAGLIFLOZIN 25 MG PO TABS
25.0000 mg | ORAL_TABLET | Freq: Every day | ORAL | 3 refills | Status: DC
  Filled 2022-08-07: qty 90, 90d supply, fill #0

## 2022-08-07 NOTE — Telephone Encounter (Signed)
Plan no longer covers Invokana, preferred alternative is Jardiance. Please advise.

## 2022-08-07 NOTE — Addendum Note (Signed)
Addended by: Gwenevere Abbot on: 08/07/2022 09:51 AM   Modules accepted: Orders

## 2022-08-17 ENCOUNTER — Ambulatory Visit (INDEPENDENT_AMBULATORY_CARE_PROVIDER_SITE_OTHER): Payer: 59 | Admitting: Medical

## 2022-08-17 ENCOUNTER — Other Ambulatory Visit (HOSPITAL_BASED_OUTPATIENT_CLINIC_OR_DEPARTMENT_OTHER): Payer: Self-pay

## 2022-08-17 VITALS — BP 138/80 | HR 56 | Resp 18 | Ht 74.0 in | Wt 290.0 lb

## 2022-08-17 DIAGNOSIS — I10 Essential (primary) hypertension: Secondary | ICD-10-CM

## 2022-08-17 DIAGNOSIS — Z7984 Long term (current) use of oral hypoglycemic drugs: Secondary | ICD-10-CM | POA: Diagnosis not present

## 2022-08-17 DIAGNOSIS — E119 Type 2 diabetes mellitus without complications: Secondary | ICD-10-CM | POA: Diagnosis not present

## 2022-08-17 DIAGNOSIS — E1169 Type 2 diabetes mellitus with other specified complication: Secondary | ICD-10-CM

## 2022-08-17 DIAGNOSIS — M1000 Idiopathic gout, unspecified site: Secondary | ICD-10-CM

## 2022-08-17 DIAGNOSIS — E785 Hyperlipidemia, unspecified: Secondary | ICD-10-CM

## 2022-08-17 MED ORDER — LOSARTAN POTASSIUM 50 MG PO TABS
50.0000 mg | ORAL_TABLET | Freq: Every day | ORAL | 3 refills | Status: AC
  Filled 2022-08-17: qty 90, 90d supply, fill #0

## 2022-08-17 MED ORDER — EMPAGLIFLOZIN 25 MG PO TABS
25.0000 mg | ORAL_TABLET | Freq: Every day | ORAL | 3 refills | Status: DC
  Filled 2022-08-17: qty 90, 90d supply, fill #0

## 2022-08-17 MED ORDER — ATORVASTATIN CALCIUM 40 MG PO TABS
40.0000 mg | ORAL_TABLET | Freq: Every day | ORAL | 3 refills | Status: DC
  Filled 2022-08-17: qty 90, 90d supply, fill #0
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
## 2022-08-17 NOTE — Patient Instructions (Addendum)
Htn- bp checked twice by myself today 130/80 and 138/80. Initially high before recheck. Some white coat effect. Continue lopressor and increase losartan to 50 mg daily for tight control due to complication.  Diabetes- recent A1c 7.7. on glipizide and recently added jardiance. Please stop by pharmacy and hopefully covered. If not let me know.  Hx of gout and recent rt knee pain after eating ethiopian food. Pain better now without colchcine. He is on allopurinol. Will get uric acid level today.  High cholesterol- refilled atorvastatin  Follow up in 3 months or sooner if needed.

## 2022-08-28 ENCOUNTER — Ambulatory Visit: Payer: 59

## 2022-08-30 ENCOUNTER — Other Ambulatory Visit (HOSPITAL_BASED_OUTPATIENT_CLINIC_OR_DEPARTMENT_OTHER): Payer: Self-pay

## 2022-10-02 ENCOUNTER — Ambulatory Visit: Payer: 59

## 2022-10-06 ENCOUNTER — Telehealth: Payer: Self-pay | Admitting: Orthopaedic Surgery

## 2022-10-06 NOTE — Telephone Encounter (Signed)
Refaxed records to [facility] [phone], records originally faxed 08/08/22

## 2022-10-18 ENCOUNTER — Encounter (INDEPENDENT_AMBULATORY_CARE_PROVIDER_SITE_OTHER): Payer: Self-pay

## 2022-11-06 ENCOUNTER — Ambulatory Visit: Payer: 59

## 2022-11-17 ENCOUNTER — Ambulatory Visit: Payer: 59 | Admitting: Medical

## 2022-12-11 ENCOUNTER — Ambulatory Visit: Payer: 59

## 2023-03-27 ENCOUNTER — Emergency Department (HOSPITAL_COMMUNITY)
Admission: EM | Admit: 2023-03-27 | Discharge: 2023-03-27 | Disposition: A | Discharge status: Home / Self Care | Payer: 59 | Attending: Emergency Medicine | Admitting: Emergency Medicine

## 2023-03-27 ENCOUNTER — Encounter (HOSPITAL_COMMUNITY): Payer: Self-pay

## 2023-03-27 DIAGNOSIS — I10 Essential (primary) hypertension: Secondary | ICD-10-CM | POA: Insufficient documentation

## 2023-03-27 DIAGNOSIS — Z79899 Other long term (current) drug therapy: Secondary | ICD-10-CM | POA: Insufficient documentation

## 2023-03-27 DIAGNOSIS — R040 Epistaxis: Secondary | ICD-10-CM | POA: Insufficient documentation

## 2023-03-27 NOTE — ED Provider Notes (Signed)
 [place] EMERGENCY DEPARTMENT AT [facility] Provider Note   CSN: [id number] Arrival date & time: 03/27/23  9049     History  Chief Complaint  Patient presents with   Epistaxis    [name] is a 58 y.o. male.  HPI 58 yo male ho hypertension presents with nosebleeding from left.  Patient has not been taking bp meds.  He has been using tissue to tamponade as needed. No lightheadedness.  No blood thinners.     Home Medications Prior to Admission medications   Medication Sig Start Date End Date Taking? Authorizing Provider  allopurinol  (ZYLOPRIM ) 300 MG tablet Take 1 tablet (300 mg total) by mouth daily. 06/13/22   Barbarann Oneil BROCKS, MD  allopurinol  (ZYLOPRIM ) 300 MG tablet Take 1 tablet (300 mg total) by mouth daily. 08/03/22   Saguier, [place], PA-C  atorvastatin  (LIPITOR) 40 MG tablet Take 1 tablet (40 mg total) by mouth daily. 08/17/22 08/18/23  Saguier, [place], PA-C  colchicine  0.6 MG tablet Take 1 tablet (0.6 mg total) by mouth 2 (two) times daily. 03/19/22   Prosperi, Christian H, PA-C  cyclobenzaprine  (FLEXERIL ) 5 MG tablet Take 5 mg by mouth at bedtime as needed. 01/17/22   [provider]  empagliflozin  (JARDIANCE ) 25 MG TABS tablet Take 1 tablet (25 mg total) by mouth daily before breakfast. 08/17/22   Saguier, [place], PA-C  glipiZIDE  (GLUCOTROL ) 5 MG tablet Take 1 tablet (5 mg total) by mouth daily before breakfast. 08/03/22   Saguier, Dallas, PA-C  losartan  (COZAAR ) 50 MG tablet Take 1 tablet (50 mg total) by mouth daily. 08/17/22   Saguier, [place], PA-C  metoprolol  tartrate (LOPRESSOR ) 50 MG tablet Take 1 tablet (50 mg total) by mouth 2 (two) times daily. 08/22/21   Saguier, [place], PA-C  naproxen  (NAPROSYN ) 500 MG tablet Take 1 tablet (500 mg total) by mouth 2 (two) times daily with a meal. 04/06/22   Barbarann Oneil BROCKS, MD      Allergies    Metformin  and related    Review of Systems   Review of Systems  Physical Exam Updated Vital Signs BP (!) 180/101  (BP Location: Left Arm)   Pulse (!) 56   Temp 98 F (36.7 C) (Oral)   Resp 18   SpO2 99%  Physical Exam Vitals reviewed.  Constitutional:      General: He is in acute distress.     Appearance: He is normal weight.  HENT:     Head: Normocephalic.     Right Ear: External ear normal.     Left Ear: External ear normal.     Nose:     Comments: Some diffuse erythema no active bleeding or bleeding site    Mouth/Throat:     Pharynx: Oropharynx is clear.  Cardiovascular:     Rate and Rhythm: Normal rate.  Pulmonary:     Effort: Pulmonary effort is normal.  Musculoskeletal:        General: Normal range of motion.  Skin:    General: Skin is warm.     Capillary Refill: Capillary refill takes less than 2 seconds.  Neurological:     General: No focal deficit present.     Mental Status: He is alert.     ED Results / Procedures / Treatments   Labs (all labs ordered are listed, but only abnormal results are displayed) Labs Reviewed - No data to display  EKG None  Radiology No results found.  Procedures Procedures    Medications  Ordered in ED Medications - No data to display  ED Course/ Medical Decision Making/ A&P                                 Medical Decision Making  Discussed conservative therapy and need for bp management.        Final Clinical Impression(s) / ED Diagnoses Final diagnoses:  Epistaxis  Hypertension, unspecified type    Rx / DC Orders ED Discharge Orders     None         Levander Houston, MD 03/27/23 1030

## 2023-03-27 NOTE — ED Triage Notes (Signed)
 Pt presents with c/o intermittent epistaxis since Sunday of this week. Pt reports he has had issues with this in the past and had to have some cautery done to the area. Pt reports he had a fall several months ago onto his face right beside the right nostril and there has been some swelling to that area since then. Pt has no bleeding at this time.

## 2023-03-27 NOTE — Discharge Instructions (Signed)
 Please take your bp meds and record your bp. Recheck with your doctor this week. Humidify air and avoid any increase in nasal pressure as discussed. Return if you are unable to control bleeding or worse at any time.

## 2023-04-25 ENCOUNTER — Encounter: Payer: Self-pay | Admitting: Medical

## 2023-04-25 ENCOUNTER — Other Ambulatory Visit (HOSPITAL_BASED_OUTPATIENT_CLINIC_OR_DEPARTMENT_OTHER): Payer: Self-pay

## 2023-04-25 ENCOUNTER — Other Ambulatory Visit: Payer: Self-pay

## 2023-04-25 ENCOUNTER — Ambulatory Visit (INDEPENDENT_AMBULATORY_CARE_PROVIDER_SITE_OTHER): Payer: 59 | Admitting: Medical

## 2023-04-25 VITALS — BP 150/90 | HR 58 | Temp 98.0°F | Resp 16 | Ht 74.0 in | Wt 292.6 lb

## 2023-04-25 DIAGNOSIS — Z125 Encounter for screening for malignant neoplasm of prostate: Secondary | ICD-10-CM

## 2023-04-25 DIAGNOSIS — I10 Essential (primary) hypertension: Secondary | ICD-10-CM

## 2023-04-25 DIAGNOSIS — I480 Paroxysmal atrial fibrillation: Secondary | ICD-10-CM

## 2023-04-25 DIAGNOSIS — E119 Type 2 diabetes mellitus without complications: Secondary | ICD-10-CM

## 2023-04-25 DIAGNOSIS — Z23 Encounter for immunization: Secondary | ICD-10-CM

## 2023-04-25 DIAGNOSIS — M1000 Idiopathic gout, unspecified site: Secondary | ICD-10-CM

## 2023-04-25 DIAGNOSIS — Z0000 Encounter for general adult medical examination without abnormal findings: Secondary | ICD-10-CM

## 2023-04-25 DIAGNOSIS — Z7984 Long term (current) use of oral hypoglycemic drugs: Secondary | ICD-10-CM

## 2023-04-25 DIAGNOSIS — Z1211 Encounter for screening for malignant neoplasm of colon: Secondary | ICD-10-CM

## 2023-04-25 LAB — COMPREHENSIVE METABOLIC PANEL
ALT: 37 U/L (ref 0–53)
AST: 31 U/L (ref 0–37)
Albumin: 4.4 g/dL (ref 3.5–5.2)
Alkaline Phosphatase: 87 U/L (ref 39–117)
BUN: 9 mg/dL (ref 6–23)
CO2: 28 meq/L (ref 19–32)
Calcium: 8.9 mg/dL (ref 8.4–10.5)
Chloride: 103 meq/L (ref 96–112)
Creatinine, Ser: 1.15 mg/dL (ref 0.40–1.50)
GFR: 71.75 mL/min (ref >60.00)
Glucose, Bld: 116 mg/dL — ABNORMAL HIGH (ref 70–99)
Potassium: 4.4 meq/L (ref 3.5–5.1)
Sodium: 138 meq/L (ref 135–145)
Total Bilirubin: 0.5 mg/dL (ref 0.2–1.2)
Total Protein: 7.2 g/dL (ref 6.0–8.3)

## 2023-04-25 LAB — CBC WITH DIFFERENTIAL/PLATELET
Basophils Absolute: 0 10*3/uL (ref 0.0–0.1)
Basophils Relative: 0.5 % (ref 0.0–3.0)
Eosinophils Absolute: 0.1 10*3/uL (ref 0.0–0.7)
Eosinophils Relative: 2.1 % (ref 0.0–5.0)
HCT: 39.7 % (ref 39.0–52.0)
Hemoglobin: 12.7 g/dL — ABNORMAL LOW (ref 13.0–17.0)
Lymphocytes Relative: 41.2 % (ref 12.0–46.0)
Lymphs Abs: 2.6 10*3/uL (ref 0.7–4.0)
MCHC: 32 g/dL (ref 30.0–36.0)
MCV: 85.3 fL (ref 78.0–100.0)
Monocytes Absolute: 0.4 10*3/uL (ref 0.1–1.0)
Monocytes Relative: 6.7 % (ref 3.0–12.0)
Neutro Abs: 3.1 10*3/uL (ref 1.4–7.7)
Neutrophils Relative %: 49.5 % (ref 43.0–77.0)
Platelets: 210 10*3/uL (ref 150.0–400.0)
RBC: 4.66 Mil/uL (ref 4.22–5.81)
RDW: 14.3 % (ref 11.5–15.5)
WBC: 6.3 10*3/uL (ref 4.0–10.5)

## 2023-04-25 LAB — LIPID PANEL
Cholesterol: 182 mg/dL (ref 0–200)
HDL: 41.6 mg/dL (ref >39.00)
LDL Cholesterol: 123 mg/dL — ABNORMAL HIGH (ref 0–99)
NonHDL: 140.09
Total CHOL/HDL Ratio: 4
Triglycerides: 86 mg/dL (ref 0.0–149.0)
VLDL: 17.2 mg/dL (ref 0.0–40.0)

## 2023-04-25 LAB — PSA: PSA: 1.19 ng/mL (ref 0.10–4.00)

## 2023-04-25 LAB — HEMOGLOBIN A1C: Hgb A1c MFr Bld: 8.5 % — ABNORMAL HIGH (ref 4.6–6.5)

## 2023-04-25 LAB — MICROALBUMIN / CREATININE URINE RATIO
Creatinine,U: 34.1 mg/dL
Microalb Creat Ratio: 2.1 mg/g (ref 0.0–30.0)
Microalb, Ur: <0.7 mg/dL (ref 0.0–1.9)

## 2023-04-25 LAB — URIC ACID: Uric Acid, Serum: 7.8 mg/dL (ref 4.0–7.8)

## 2023-04-25 MED ORDER — GLIPIZIDE 5 MG PO TABS
5.0000 mg | ORAL_TABLET | Freq: Every day | ORAL | 3 refills | Status: AC
  Filled 2023-04-25: qty 90, 90d supply, fill #0
  Filled 2023-09-14: qty 90, 90d supply, fill #1
  Filled 2024-02-06 (×2): qty 90, 90d supply, fill #2

## 2023-04-25 MED ORDER — CHLORTHALIDONE 25 MG PO TABS
25.0000 mg | ORAL_TABLET | Freq: Every day | ORAL | 3 refills | Status: AC
  Filled 2023-04-25 (×2): qty 90, 90d supply, fill #0
  Filled 2023-09-14: qty 90, 90d supply, fill #1

## 2023-04-25 MED ORDER — METOPROLOL TARTRATE 50 MG PO TABS
50.0000 mg | ORAL_TABLET | Freq: Two times a day (BID) | ORAL | 3 refills | Status: AC
  Filled 2023-04-25: qty 180, 90d supply, fill #0
  Filled 2023-09-14: qty 180, 90d supply, fill #1

## 2023-04-25 NOTE — Addendum Note (Signed)
 Addended by: Zaire Vanbuskirk M on: 04/25/2023 10:26 AM   Modules accepted: Orders
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
## 2023-04-25 NOTE — Patient Instructions (Addendum)
 For you wellness exam today I have ordered cbc, cmp, psa and lipid panel.  Vaccine given shingrix . Other vaccines declined.  Referral for colonoscopy  Recommend exercise and healthy diet.  We will let you know lab results as they come in.  Follow up date appointment will be determined after lab review.    Hypertension Uncontrolled on Metoprolol . Losartan  discontinued due to reported knee pain. Lisinopril  discontinued due to reported mouth swelling. -Start Chlorthalidone  25mg  daily. -Recheck blood pressure in 2 weeks.  Diabetes Mellitus On Glipizide  and Jardiance . Last A1C unknown. -Check A1C and lipid panel today. -Continue current regimen and consider adjustment based on A1C results.  Gout No current flare. -Check uric acid level today.  Paroxysmal Atrial Fibrillation No recent follow-up with cardiology. -Advise patient to schedule follow-up with cardiology.  General Health Maintenance -Administer Shingrix  vaccine today.  Preventive Care 33-48 Years Old, Male Preventive care refers to lifestyle choices and visits with your health care provider that can promote health and wellness. Preventive care visits are also called wellness exams. What can I expect for my preventive care visit? Counseling During your preventive care visit, your health care provider may ask about your: Medical history, including: Past medical problems. Family medical history. Current health, including: Emotional well-being. Home life and relationship well-being. Sexual activity. Lifestyle, including: Alcohol, nicotine or tobacco, and drug use. Access to firearms. Diet, exercise, and sleep habits. Safety issues such as seatbelt and bike helmet use. Sunscreen use. Work and work astronomer. Physical exam Your health care provider will check your: Height and weight. These may be used to calculate your BMI (body mass index). BMI is a measurement that tells if you are at a healthy weight. Waist  circumference. This measures the distance around your waistline. This measurement also tells if you are at a healthy weight and may help predict your risk of certain diseases, such as type 2 diabetes and high blood pressure. Heart rate and blood pressure. Body temperature. Skin for abnormal spots. What immunizations do I need?  Vaccines are usually given at various ages, according to a schedule. Your health care provider will recommend vaccines for you based on your age, medical history, and lifestyle or other factors, such as travel or where you work. What tests do I need? Screening Your health care provider may recommend screening tests for certain conditions. This may include: Lipid and cholesterol levels. Diabetes screening. This is done by checking your blood sugar (glucose) after you have not eaten for a while (fasting). Hepatitis B test. Hepatitis C test. HIV (human immunodeficiency virus) test. STI (sexually transmitted infection) testing, if you are at risk. Lung cancer screening. Prostate cancer screening. Colorectal cancer screening. Talk with your health care provider about your test results, treatment options, and if necessary, the need for more tests. Follow these instructions at home: Eating and drinking  Eat a diet that includes fresh fruits and vegetables, whole grains, lean protein, and low-fat dairy products. Take vitamin and mineral supplements as recommended by your health care provider. Do not drink alcohol if your health care provider tells you not to drink. If you drink alcohol: Limit how much you have to 0-2 drinks a day. Know how much alcohol is in your drink. In the [place] one drink equals one 12 oz bottle of beer (355 mL), one 5 oz glass of wine (148 mL), or one 1 oz glass of hard liquor (44 mL). Lifestyle Brush your teeth every morning and night with fluoride toothpaste. Floss one time each  day. Exercise for at least 30 minutes 5 or more days each week. Do  not use any products that contain nicotine or tobacco. These products include cigarettes, chewing tobacco, and vaping devices, such as e-cigarettes. If you need help quitting, ask your health care provider. Do not use drugs. If you are sexually active, practice safe sex. Use a condom or other form of protection to prevent STIs. Take aspirin only as told by your health care provider. Make sure that you understand how much to take and what form to take. Work with your health care provider to find out whether it is safe and beneficial for you to take aspirin daily. Find healthy ways to manage stress, such as: Meditation, yoga, or listening to music. Journaling. Talking to a trusted person. Spending time with friends and family. Minimize exposure to UV radiation to reduce your risk of skin cancer. Safety Always wear your seat belt while driving or riding in a vehicle. Do not drive: If you have been drinking alcohol. Do not ride with someone who has been drinking. When you are tired or distracted. While texting. If you have been using any mind-altering substances or drugs. Wear a helmet and other protective equipment during sports activities. If you have firearms in your house, make sure you follow all gun safety procedures. What's next? Go to your health care provider once a year for an annual wellness visit. Ask your health care provider how often you should have your eyes and teeth checked. Stay up to date on all vaccines. This information is not intended to replace advice given to you by your health care provider. Make sure you discuss any questions you have with your health care provider. Document Revised: 09/01/2020 Document Reviewed: 09/01/2020 Elsevier Patient Education  2024 [employer].

## 2023-04-25 NOTE — Addendum Note (Signed)
 Addended by: Serafina Damme on: 04/25/2023 11:29 AM   Modules accepted: Orders

## 2023-04-28 ENCOUNTER — Encounter: Payer: Self-pay | Admitting: Medical

## 2023-05-01 ENCOUNTER — Telehealth: Payer: Self-pay

## 2023-05-01 NOTE — Telephone Encounter (Signed)
Copied from CRM [id number]. Topic: Clinical - Lab/Test Results >> May 01, 2023  3:42 PM Theodis Sato wrote: Reason for CRM: Results from patients most recent lab results relayed. Patient states he is only taking Glipizide (not Jardiance) Per the question from [employer] in patients lab results message.

## 2023-05-07 NOTE — Telephone Encounter (Signed)
[name] is on pt medication sheet, pt has a follow up on 05/09/23

## 2023-05-09 ENCOUNTER — Ambulatory Visit: Payer: 59 | Admitting: Medical

## 2023-06-05 ENCOUNTER — Ambulatory Visit: Payer: 59 | Admitting: Medical

## 2023-06-19 ENCOUNTER — Other Ambulatory Visit (HOSPITAL_BASED_OUTPATIENT_CLINIC_OR_DEPARTMENT_OTHER): Payer: Self-pay

## 2023-06-19 ENCOUNTER — Ambulatory Visit: Admitting: Medical

## 2023-06-19 VITALS — BP 130/60 | HR 61 | Resp 20 | Ht 74.0 in | Wt 294.0 lb

## 2023-06-19 DIAGNOSIS — I10 Essential (primary) hypertension: Secondary | ICD-10-CM

## 2023-06-19 DIAGNOSIS — E1169 Type 2 diabetes mellitus with other specified complication: Secondary | ICD-10-CM

## 2023-06-19 DIAGNOSIS — E785 Hyperlipidemia, unspecified: Secondary | ICD-10-CM | POA: Diagnosis not present

## 2023-06-19 DIAGNOSIS — Z7984 Long term (current) use of oral hypoglycemic drugs: Secondary | ICD-10-CM

## 2023-06-19 DIAGNOSIS — E119 Type 2 diabetes mellitus without complications: Secondary | ICD-10-CM

## 2023-06-19 MED ORDER — EMPAGLIFLOZIN 25 MG PO TABS
25.0000 mg | ORAL_TABLET | Freq: Every day | ORAL | 3 refills | Status: AC
  Filled 2023-06-19: qty 90, 90d supply, fill #0
  Filled 2024-02-06: qty 90, 90d supply, fill #1

## 2023-06-19 MED ORDER — OLOPATADINE HCL 0.1 % OP SOLN
1.0000 [drp] | Freq: Two times a day (BID) | OPHTHALMIC | 12 refills | Status: AC
  Filled 2023-06-19: qty 5, 30d supply, fill #0

## 2023-06-19 MED ORDER — AZELASTINE HCL 0.1 % NA SOLN
2.0000 | Freq: Two times a day (BID) | NASAL | 12 refills | Status: AC
  Filled 2023-06-19: qty 30, 30d supply, fill #0

## 2023-06-19 NOTE — Patient Instructions (Signed)
 Type 2 Diabetes Mellitus A1c is 8.5%, indicating suboptimal glycemic control with an average blood glucose of approximately 205 mg/dL. He is on glipizide 5 mg daily. Jardiance was previously prescribed but not taken due to pharmacy issues. Restarting Jardiance is recommended to improve glycemic control. - Restart Jardiance 25 mg daily - Order A1c and metabolic panel for the week of May 12th - Educate on low sugar diet and exercise - Advise on consuming bananas with lower sugar content (greenish) -or google search high potassium foods.   Hypertension Blood pressure is well-controlled at 130/60 mmHg. He is on chlorthalidone and monitors blood pressure at home once or twice a week. No muscle cramps reported, but experiences tendon pain in ankle area due to new boots. Advised to consume high potassium foods to prevent hypokalemia due to diuretics. - Continue chlorthalidone - Advise intake of high potassium foods to prevent hypokalemia - Educate on potential side effects of diuretics, including muscle cramps - If cramping occurs, contact the clinic  Hyperlipidemia He is on atorvastatin 40 mg daily. No specific lipid panel results discussed in this encounter. - Continue atorvastatin 40 mg daily  General Health Maintenance Exercises three times a week for at least an hour and a half. Advised on dietary modifications to support overall health and manage diabetes and hypertension. Encouraged to consume high potassium foods, especially nuts, to maintain potassium levels without raising blood sugar. - Encourage regular exercise - Advise on low cholesterol diet - Educate on high potassium foods, especially nuts, to maintain potassium levels without raising blood sugar   Nasal congestion and itchy eyes due to pollen -astelin nasal spray  and use olpatadine for eye itching   Follow up date to be determined after lab review in May.
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
## 2023-09-14 ENCOUNTER — Other Ambulatory Visit: Payer: Self-pay

## 2023-09-14 ENCOUNTER — Other Ambulatory Visit (HOSPITAL_BASED_OUTPATIENT_CLINIC_OR_DEPARTMENT_OTHER): Payer: Self-pay

## 2023-09-25 ENCOUNTER — Other Ambulatory Visit (HOSPITAL_BASED_OUTPATIENT_CLINIC_OR_DEPARTMENT_OTHER): Payer: Self-pay

## 2023-11-29 ENCOUNTER — Emergency Department (HOSPITAL_BASED_OUTPATIENT_CLINIC_OR_DEPARTMENT_OTHER)
Admission: EM | Admit: 2023-11-29 | Discharge: 2023-11-29 | Disposition: A | Discharge status: Home / Self Care | Attending: Emergency Medicine | Admitting: Emergency Medicine

## 2023-11-29 ENCOUNTER — Emergency Department (HOSPITAL_BASED_OUTPATIENT_CLINIC_OR_DEPARTMENT_OTHER)

## 2023-11-29 ENCOUNTER — Encounter (HOSPITAL_BASED_OUTPATIENT_CLINIC_OR_DEPARTMENT_OTHER): Payer: Self-pay

## 2023-11-29 ENCOUNTER — Other Ambulatory Visit (HOSPITAL_BASED_OUTPATIENT_CLINIC_OR_DEPARTMENT_OTHER): Payer: Self-pay

## 2023-11-29 ENCOUNTER — Other Ambulatory Visit: Payer: Self-pay

## 2023-11-29 DIAGNOSIS — R42 Dizziness and giddiness: Secondary | ICD-10-CM | POA: Insufficient documentation

## 2023-11-29 DIAGNOSIS — R059 Cough, unspecified: Secondary | ICD-10-CM | POA: Diagnosis present

## 2023-11-29 DIAGNOSIS — J181 Lobar pneumonia, unspecified organism: Secondary | ICD-10-CM | POA: Insufficient documentation

## 2023-11-29 DIAGNOSIS — J189 Pneumonia, unspecified organism: Secondary | ICD-10-CM

## 2023-11-29 LAB — CBC WITH DIFFERENTIAL/PLATELET
Abs Immature Granulocytes: 0.04 K/uL (ref 0.00–0.07)
Basophils Absolute: 0 K/uL (ref 0.0–0.1)
Basophils Relative: 0 %
Eosinophils Absolute: 0.1 K/uL (ref 0.0–0.5)
Eosinophils Relative: 1 %
HCT: 39.1 % (ref 39.0–52.0)
Hemoglobin: 12.9 g/dL — ABNORMAL LOW (ref 13.0–17.0)
Immature Granulocytes: 0 %
Lymphocytes Relative: 19 %
Lymphs Abs: 2 K/uL (ref 0.7–4.0)
MCH: 26.9 pg (ref 26.0–34.0)
MCHC: 33 g/dL (ref 30.0–36.0)
MCV: 81.6 fL (ref 80.0–100.0)
Monocytes Absolute: 1.4 K/uL — ABNORMAL HIGH (ref 0.1–1.0)
Monocytes Relative: 13 %
Neutro Abs: 7.4 K/uL (ref 1.7–7.7)
Neutrophils Relative %: 67 %
Platelets: 253 K/uL (ref 150–400)
RBC: 4.79 MIL/uL (ref 4.22–5.81)
RDW: 13.2 % (ref 11.5–15.5)
WBC: 10.9 K/uL — ABNORMAL HIGH (ref 4.0–10.5)
nRBC: 0 % (ref 0.0–0.2)

## 2023-11-29 LAB — COMPREHENSIVE METABOLIC PANEL WITH GFR
ALT: 65 U/L — ABNORMAL HIGH (ref 0–44)
AST: 46 U/L — ABNORMAL HIGH (ref 15–41)
Albumin: 4.4 g/dL (ref 3.5–5.0)
Alkaline Phosphatase: 82 U/L (ref 38–126)
Anion gap: 15 (ref 5–15)
BUN: 16 mg/dL (ref 6–20)
CO2: 25 mmol/L (ref 22–32)
Calcium: 9.2 mg/dL (ref 8.9–10.3)
Chloride: 92 mmol/L — ABNORMAL LOW (ref 98–111)
Creatinine, Ser: 1.44 mg/dL — ABNORMAL HIGH (ref 0.61–1.24)
GFR, Estimated: 57 mL/min — ABNORMAL LOW (ref >60)
Glucose, Bld: 128 mg/dL — ABNORMAL HIGH (ref 70–99)
Potassium: 3.5 mmol/L (ref 3.5–5.1)
Sodium: 133 mmol/L — ABNORMAL LOW (ref 135–145)
Total Bilirubin: 0.6 mg/dL (ref 0.0–1.2)
Total Protein: 8.2 g/dL — ABNORMAL HIGH (ref 6.5–8.1)

## 2023-11-29 LAB — RESP PANEL BY RT-PCR (RSV, FLU A&B, COVID)  RVPGX2
Influenza A by PCR: NEGATIVE
Influenza B by PCR: NEGATIVE
Resp Syncytial Virus by PCR: NEGATIVE
SARS Coronavirus 2 by RT PCR: NEGATIVE

## 2023-11-29 MED ORDER — SODIUM CHLORIDE 0.9 % IV BOLUS
1000.0000 mL | Freq: Once | INTRAVENOUS | Status: AC
  Administered 2023-11-29: 1000 mL via INTRAVENOUS

## 2023-11-29 MED ORDER — BENZONATATE 100 MG PO CAPS
100.0000 mg | ORAL_CAPSULE | Freq: Three times a day (TID) | ORAL | 0 refills | Status: AC
  Filled 2023-11-29: qty 21, 7d supply, fill #0

## 2023-11-29 MED ORDER — ONDANSETRON 4 MG PO TBDP
4.0000 mg | ORAL_TABLET | ORAL | 0 refills | Status: AC | PRN
  Filled 2023-11-29: qty 20, 4d supply, fill #0

## 2023-11-29 MED ORDER — ACETAMINOPHEN 325 MG PO TABS
650.0000 mg | ORAL_TABLET | Freq: Once | ORAL | Status: AC
  Administered 2023-11-29: 650 mg via ORAL
  Filled 2023-11-29: qty 2

## 2023-11-29 MED ORDER — KETOROLAC TROMETHAMINE 15 MG/ML IJ SOLN
15.0000 mg | Freq: Once | INTRAMUSCULAR | Status: AC
Start: 2023-11-29 — End: 2023-11-29
  Administered 2023-11-29: 15 mg via INTRAVENOUS
  Filled 2023-11-29: qty 1

## 2023-11-29 MED ORDER — AZITHROMYCIN 250 MG PO TABS
ORAL_TABLET | ORAL | 0 refills | Status: AC
  Filled 2023-11-29: qty 6, 5d supply, fill #0

## 2023-11-29 MED ORDER — AMOXICILLIN-POT CLAVULANATE 875-125 MG PO TABS
1.0000 | ORAL_TABLET | Freq: Two times a day (BID) | ORAL | 0 refills | Status: AC
  Filled 2023-11-29: qty 14, 7d supply, fill #0

## 2023-11-29 MED ORDER — ONDANSETRON HCL 4 MG/2ML IJ SOLN
4.0000 mg | Freq: Once | INTRAMUSCULAR | Status: AC
  Administered 2023-11-29: 4 mg via INTRAVENOUS
  Filled 2023-11-29: qty 2

## 2023-11-29 NOTE — Discharge Instructions (Signed)
 Take tylenol  2 pills 4 times a day and motrin  4 pills 3 times a day.  Drink plenty of fluids.  Return for worsening shortness of breath, headache, confusion. Follow up with your family doctor.   Imodium for diarrhea.    Your kidney function today was worse than it normally is.  Try to eat and drink as well as you can for at least the next few days.  This hopefully will be rechecked by your doctor in a week or 2.

## 2023-11-29 NOTE — ED Provider Notes (Signed)
 [place] [facility] Provider Note   CSN: [id number] Arrival date & time: 11/29/23  [id number]     Patient presents with: Dizziness, Generalized Body Aches, and Chills   [name] is a 58 y.o. male.   58 yo M with a chief complaints of cough congestion headache fevers and chills diarrhea going on for a little bit over a week now.  He said the dizziness really started this morning.  Came in for evaluation.  No abdominal pain no dark stool or blood in his stool.  Was in [place] a few weeks ago.   Dizziness      Prior to Admission medications   Medication Sig Start Date End Date Taking? Authorizing Provider  amoxicillin -clavulanate (AUGMENTIN ) 875-125 MG tablet Take 1 tablet by mouth every 12 (twelve) hours. 11/29/23  Yes Emil Share, DO  azithromycin  (ZITHROMAX ) 250 MG tablet Take 1 tablet (250 mg total) by mouth daily. Take first 2 tablets together, then 1 every day until finished. 11/29/23  Yes Emil Share, DO  benzonatate  (TESSALON ) 100 MG capsule Take 1 capsule (100 mg total) by mouth every 8 (eight) hours. 11/29/23  Yes Emil Share, DO  ondansetron  (ZOFRAN -ODT) 4 MG disintegrating tablet 4mg  ODT q4 hours prn nausea/vomit 11/29/23  Yes Olimpia Tinch, DO  allopurinol  (ZYLOPRIM ) 300 MG tablet Take 1 tablet (300 mg total) by mouth daily. 06/13/22   Barbarann Oneil BROCKS, MD  allopurinol  (ZYLOPRIM ) 300 MG tablet Take 1 tablet (300 mg total) by mouth daily. 08/03/22   Saguier, [place], PA-C  atorvastatin  (LIPITOR) 40 MG tablet Take 1 tablet (40 mg total) by mouth daily. 08/17/22 08/18/23  Saguier, [place], PA-C  azelastine  (ASTELIN ) 0.1 % nasal spray Place 2 sprays into both nostrils 2 (two) times daily. Use in each nostril as directed. 06/19/23   Saguier, [place], PA-C  chlorthalidone  (HYGROTON ) 25 MG tablet Take 1 tablet (25 mg total) by mouth daily. 04/25/23   Saguier, [place], PA-C  colchicine  0.6 MG tablet Take 1 tablet (0.6 mg total) by mouth 2 (two) times daily. 03/19/22    Prosperi, Christian H, PA-C  cyclobenzaprine  (FLEXERIL ) 5 MG tablet Take 5 mg by mouth at bedtime as needed. 01/17/22   [provider]  empagliflozin  (JARDIANCE ) 25 MG TABS tablet Take 1 tablet (25 mg total) by mouth daily before breakfast. 06/19/23   Saguier, Dallas, PA-C  glipiZIDE  (GLUCOTROL ) 5 MG tablet Take 1 tablet (5 mg total) by mouth daily before breakfast. 04/25/23   Saguier, Dallas, PA-C  losartan  (COZAAR ) 50 MG tablet Take 1 tablet (50 mg total) by mouth daily. 08/17/22   Saguier, [place], PA-C  metoprolol  tartrate (LOPRESSOR ) 50 MG tablet Take 1 tablet (50 mg total) by mouth 2 (two) times daily. 04/25/23   Saguier, [place], PA-C  naproxen  (NAPROSYN ) 500 MG tablet Take 1 tablet (500 mg total) by mouth 2 (two) times daily with a meal. 04/06/22   Barbarann Oneil BROCKS, MD  olopatadine  (PATANOL) 0.1 % ophthalmic solution Place 1 drop into both eyes 2 (two) times daily. 06/19/23   Saguier, [place], PA-C    Allergies: Metformin  and related    Review of Systems  Neurological:  Positive for dizziness.    Updated Vital Signs BP 136/89 (BP Location: Right Arm)   Pulse 72   Temp (!) 100.4 F (38 C) (Oral)   Resp 18   Ht 6' 2 (1.88 m)   Wt 120.2 kg   SpO2 97%   BMI 34.02 kg/m   Physical  Exam Vitals and nursing note reviewed.  Constitutional:      Appearance: He is well-developed.  HENT:     Head: Normocephalic and atraumatic.  Eyes:     Pupils: Pupils are equal, round, and reactive to light.  Neck:     Vascular: No JVD.  Cardiovascular:     Rate and Rhythm: Normal rate and regular rhythm.     Heart sounds: No murmur heard.    No friction rub. No gallop.  Pulmonary:     Effort: No respiratory distress.     Breath sounds: No wheezing.  Abdominal:     General: There is no distension.     Tenderness: There is no abdominal tenderness. There is no guarding or rebound.  Musculoskeletal:        General: Normal range of motion.     Cervical back: Normal range of motion and neck  supple.  Skin:    Coloration: Skin is not pale.     Findings: No rash.  Neurological:     Mental Status: He is alert and oriented to person, place, and time.  Psychiatric:        Behavior: Behavior normal.     (all labs ordered are listed, but only abnormal results are displayed) Labs Reviewed  COMPREHENSIVE METABOLIC PANEL WITH GFR - Abnormal; Notable for the following components:      Result Value   Sodium 133 (*)    Chloride 92 (*)    Glucose, Bld 128 (*)    Creatinine, Ser 1.44 (*)    Total Protein 8.2 (*)    AST 46 (*)    ALT 65 (*)    GFR, Estimated 57 (*)    All other components within normal limits  CBC WITH DIFFERENTIAL/PLATELET - Abnormal; Notable for the following components:   WBC 10.9 (*)    Hemoglobin 12.9 (*)    Monocytes Absolute 1.4 (*)    All other components within normal limits  RESP PANEL BY RT-PCR (RSV, FLU A&B, COVID)  [id number]    EKG: EKG Interpretation Date/Time:  Thursday November 29 2023 07:32:15 EDT Ventricular Rate:  77 PR Interval:  178 QRS Duration:  96 QT Interval:  364 QTC Calculation: 412 R Axis:   24  Text Interpretation: Sinus rhythm No significant change since last tracing Confirmed by Emil Share [phone]) on 11/29/2023 7:40:18 AM  Radiology: [facility] Chest Port 1 View Result Date: 11/29/2023 CLINICAL DATA:  58 year old male with cough, fever, headache, dizziness. EXAM: PORTABLE CHEST 1 VIEW COMPARISON:  Portable chest 08/05/2019 and earlier. FINDINGS: Portable AP semi upright view at 0651 hours. Normal lung volumes and mediastinal contours. Indistinct asymmetric left perihilar opacity. Lung markings elsewhere appears stable and within normal limits. No pneumothorax, pleural effusion. No definite consolidation. Visualized tracheal air column is within normal limits. No acute osseous abnormality identified.  Paucity of bowel gas. IMPRESSION: Confluent but indistinct left perihilar opacity, nonspecific but in the setting of fever favor a Left  lung Bronchopneumonia. No pleural effusion. Followup PA and lateral chest X-ray is recommended in 3-4 weeks following trial of antibiotic therapy to ensure resolution. Electronically Signed   By: VEAR Hurst M.D.   On: 11/29/2023 07:25     Procedures   Medications Ordered in the ED  acetaminophen  (TYLENOL ) tablet 650 mg (650 mg Oral Given 11/29/23 0733)  sodium chloride  0.9 % bolus 1,000 mL (1,000 mLs Intravenous New Bag/Given 11/29/23 0736)  ondansetron  (ZOFRAN ) injection 4 mg (4 mg Intravenous Given 11/29/23 0736)  ketorolac  (TORADOL ) 15 MG/ML injection 15 mg (15 mg Intravenous Given 11/29/23 0736)                                    Medical Decision Making Risk Prescription drug management.   58 yo M with a chief complaints of cough congestion headache dizziness diarrhea going on for about a week now.  Likely viral syndrome by history.  Will give a bolus of IV fluid, treat symptoms.  Reassess.  Patient feeling a bit better on repeat assessments.  Small bump in renal function.  Mild leukocytosis.  COVID flu and RSV are negative.  Chest x-ray independently interpreted by me with left-sided infiltrate.  Will treat as bacterial pneumonia.  PCP follow-up.  7:48 AM:  I have discussed the diagnosis/risks/treatment options with the patient and family.  Evaluation and diagnostic testing in the emergency department does not suggest an emergent condition requiring admission or immediate intervention beyond what has been performed at this time.  They will follow up with PCP. We also discussed returning to the ED immediately if new or worsening sx occur. We discussed the sx which are most concerning (e.g., sudden worsening pain, fever, inability to tolerate by mouth, sob, hemoptysis, syncope) that necessitate immediate return. Medications administered to the patient during their visit and any new prescriptions provided to the patient are listed below.  Medications given during this visit Medications   acetaminophen  (TYLENOL ) tablet 650 mg (650 mg Oral Given 11/29/23 0733)  sodium chloride  0.9 % bolus 1,000 mL (1,000 mLs Intravenous New Bag/Given 11/29/23 0736)  ondansetron  (ZOFRAN ) injection 4 mg (4 mg Intravenous Given 11/29/23 0736)  ketorolac  (TORADOL ) 15 MG/ML injection 15 mg (15 mg Intravenous Given 11/29/23 0736)     The patient appears reasonably screen and/or stabilized for discharge and I doubt any other medical condition or other [facility] requiring further screening, evaluation, or treatment in the ED at this time prior to discharge.       Final diagnoses:  Community acquired pneumonia of left lower lobe of lung    ED Discharge Orders          Ordered    amoxicillin -clavulanate (AUGMENTIN ) 875-125 MG tablet  Every 12 hours        11/29/23 0746    azithromycin  (ZITHROMAX ) 250 MG tablet  Daily        11/29/23 0746    benzonatate  (TESSALON ) 100 MG capsule  Every 8 hours        11/29/23 0746    ondansetron  (ZOFRAN -ODT) 4 MG disintegrating tablet        11/29/23 0746               Emil Share, DO 11/29/23 [phone]

## 2023-11-29 NOTE — ED Triage Notes (Signed)
 Patient arrived POV with multiple complaints.  Patient reports having body aches, headache, dizziness, hot and cold chills starting 11/23/23. Patient reports symptoms have progressively gotten worse.

## 2023-12-10 ENCOUNTER — Encounter: Payer: Self-pay | Admitting: Medical

## 2023-12-10 ENCOUNTER — Ambulatory Visit: Payer: Self-pay | Admitting: Medical

## 2023-12-10 ENCOUNTER — Ambulatory Visit: Admitting: Medical

## 2023-12-10 ENCOUNTER — Other Ambulatory Visit (HOSPITAL_BASED_OUTPATIENT_CLINIC_OR_DEPARTMENT_OTHER): Payer: Self-pay

## 2023-12-10 VITALS — BP 137/90 | HR 79 | Temp 97.6°F | Resp 15 | Ht 74.0 in | Wt 278.8 lb

## 2023-12-10 DIAGNOSIS — E785 Hyperlipidemia, unspecified: Secondary | ICD-10-CM

## 2023-12-10 DIAGNOSIS — Z23 Encounter for immunization: Secondary | ICD-10-CM

## 2023-12-10 DIAGNOSIS — I480 Paroxysmal atrial fibrillation: Secondary | ICD-10-CM

## 2023-12-10 DIAGNOSIS — E119 Type 2 diabetes mellitus without complications: Secondary | ICD-10-CM | POA: Diagnosis not present

## 2023-12-10 DIAGNOSIS — J189 Pneumonia, unspecified organism: Secondary | ICD-10-CM

## 2023-12-10 DIAGNOSIS — I10 Essential (primary) hypertension: Secondary | ICD-10-CM | POA: Diagnosis not present

## 2023-12-10 DIAGNOSIS — E1169 Type 2 diabetes mellitus with other specified complication: Secondary | ICD-10-CM

## 2023-12-10 DIAGNOSIS — Z1211 Encounter for screening for malignant neoplasm of colon: Secondary | ICD-10-CM

## 2023-12-10 LAB — MICROALBUMIN / CREATININE URINE RATIO
Creatinine,U: 155.2 mg/dL
Microalb Creat Ratio: 9.9 mg/g (ref 0.0–30.0)
Microalb, Ur: 1.5 mg/dL (ref 0.0–1.9)

## 2023-12-10 LAB — HEMOGLOBIN A1C: Hgb A1c MFr Bld: 8.2 % — ABNORMAL HIGH (ref 4.6–6.5)

## 2023-12-10 MED ORDER — SAXAGLIPTIN HCL 5 MG PO TABS
5.0000 mg | ORAL_TABLET | Freq: Every day | ORAL | 11 refills | Status: AC
  Filled 2023-12-10: qty 30, 30d supply, fill #0

## 2023-12-10 MED ORDER — ATORVASTATIN CALCIUM 40 MG PO TABS
40.0000 mg | ORAL_TABLET | Freq: Every day | ORAL | 3 refills | Status: AC
  Filled 2023-12-10: qty 90, 90d supply, fill #0

## 2023-12-10 MED ORDER — LOSARTAN POTASSIUM 100 MG PO TABS
100.0000 mg | ORAL_TABLET | Freq: Every day | ORAL | 3 refills | Status: AC
  Filled 2023-12-10: qty 90, 90d supply, fill #0
  Filled 2024-02-06: qty 90, 90d supply, fill #1

## 2023-12-10 NOTE — Addendum Note (Signed)
 Addended by: DORINA DALLAS HERO on: 12/10/2023 08:50 PM   Modules accepted: Orders
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
## 2023-12-10 NOTE — Patient Instructions (Addendum)
 Recent pneumonia Recent pneumonia confirmed by chest x-ray. Symptoms improved significantly. - Order repeat chest x-ray around October 11th to ensure resolution.  Type 2 diabetes mellitus Type 2 diabetes with suboptimal control, last A1c 8.5%, average blood sugar 205 mg/dL. -continue current diabetic meds. - Check A1c today. - Follow up every three months for A1c and kidney function monitoring.  Hypertension Hypertension with average readings 140/81 mmHg. Target for diabetic patients is 130/80 mmHg or less. - Increase losartan  to 100 mg daily. Continue other meds the same. - Check blood pressure two to three times a week using proper technique.  Chronic kidney disease, stage 2 Stage 2 chronic kidney disease with creatinine 1.44 mg/dL, GFR 57 fO/fpw/8.26 m. - Check metabolic panel today to assess kidney function.  Hyperlipidemia Hyperlipidemia with high cardiovascular risk score of 22%. Strong recommendation for statin therapy. - Refill atorvastatin  prescription. - Check lipid panel today.   PAF  With no current symptoms. Not on anticoagulation therapy. - Refer to cardiology for follow-up and potential monitoring.  shingrix  vaccine today.  Diabetic eye exam referral placed.  Placed coloquard order  Follow up in 3 months or sooner if needed    Dr. Inocencio  [phone]

## 2023-12-11 LAB — COMPLETE METABOLIC PANEL WITHOUT GFR
AG Ratio: 1.5 (calc) (ref 1.0–2.5)
ALT: 29 U/L (ref 9–46)
AST: 18 U/L (ref 10–35)
Albumin: 4.5 g/dL (ref 3.6–5.1)
Alkaline phosphatase (APISO): 76 U/L (ref 35–144)
BUN: 19 mg/dL (ref 7–25)
CO2: 26 mmol/L (ref 20–32)
Calcium: 9.5 mg/dL (ref 8.6–10.3)
Chloride: 101 mmol/L (ref 98–110)
Creat: 1.23 mg/dL (ref 0.70–1.30)
Globulin: 3 g/dL (ref 1.9–3.7)
Glucose, Bld: 105 mg/dL — ABNORMAL HIGH (ref 65–99)
Potassium: 4.4 mmol/L (ref 3.5–5.3)
Sodium: 139 mmol/L (ref 135–146)
Total Bilirubin: 0.3 mg/dL (ref 0.2–1.2)
Total Protein: 7.5 g/dL (ref 6.1–8.1)

## 2023-12-11 LAB — LIPID PANEL
Cholesterol: 208 mg/dL — ABNORMAL HIGH (ref <200)
HDL: 42 mg/dL (ref >=40)
LDL Cholesterol (Calc): 143 mg/dL — ABNORMAL HIGH
Non-HDL Cholesterol (Calc): 166 mg/dL — ABNORMAL HIGH (ref <130)
Total CHOL/HDL Ratio: 5 (calc) — ABNORMAL HIGH (ref <5.0)
Triglycerides: 115 mg/dL (ref <150)

## 2023-12-21 ENCOUNTER — Other Ambulatory Visit (HOSPITAL_BASED_OUTPATIENT_CLINIC_OR_DEPARTMENT_OTHER): Payer: Self-pay

## 2023-12-24 LAB — COLOGUARD: COLOGUARD: NEGATIVE

## 2024-01-24 ENCOUNTER — Ambulatory Visit: Admitting: Medical

## 2024-02-06 ENCOUNTER — Other Ambulatory Visit: Payer: Self-pay

## 2024-02-06 ENCOUNTER — Other Ambulatory Visit (HOSPITAL_BASED_OUTPATIENT_CLINIC_OR_DEPARTMENT_OTHER): Payer: Self-pay

## 2024-03-31 LAB — OPHTHALMOLOGY REPORT-SCANNED
